# Patient Record
Sex: Male | Born: 1935 | Race: White | Hispanic: No | Marital: Married
Health system: Southern US, Community
[De-identification: ages and names within clinical notes are randomized; demographics above are authoritative.]

## PROBLEM LIST (undated history)

## (undated) DIAGNOSIS — E785 Hyperlipidemia, unspecified: Secondary | ICD-10-CM

## (undated) DIAGNOSIS — E119 Type 2 diabetes mellitus without complications: Secondary | ICD-10-CM

## (undated) DIAGNOSIS — T7840XA Allergy, unspecified, initial encounter: Secondary | ICD-10-CM

## (undated) DIAGNOSIS — F0390 Unspecified dementia without behavioral disturbance: Secondary | ICD-10-CM

## (undated) DIAGNOSIS — K219 Gastro-esophageal reflux disease without esophagitis: Secondary | ICD-10-CM

## (undated) HISTORY — DX: Gastro-esophageal reflux disease without esophagitis: K21.9

## (undated) HISTORY — DX: Allergy, unspecified, initial encounter: T78.40XA

## (undated) HISTORY — PX: HERNIA REPAIR: SHX51

---

## 2005-08-03 ENCOUNTER — Emergency Department (HOSPITAL_COMMUNITY): Admission: EM | Admit: 2005-08-03 | Discharge: 2005-08-03 | Payer: Self-pay | Admitting: Emergency Medicine

## 2008-04-19 ENCOUNTER — Ambulatory Visit (HOSPITAL_COMMUNITY): Admission: RE | Admit: 2008-04-19 | Discharge: 2008-04-19 | Payer: Self-pay | Admitting: Surgery

## 2010-10-01 NOTE — Op Note (Signed)
NAME:  Aaron Farmer, Aaron Farmer NO.:  [id number]   MEDICAL RECORD NO.:  [id number]          PATIENT TYPE:  AMB   LOCATION:  SDS                          FACILITY:  [facility]   PHYSICIAN:  Wilmon Arms. Corliss Skains, M.D. DATE OF BIRTH:  1930/05/21   DATE OF PROCEDURE:  04/19/2008  DATE OF DISCHARGE:  04/19/2008                               OPERATIVE REPORT   PREOPERATIVE DIAGNOSIS:  Right inguinal hernia.   POSTOPERATIVE DIAGNOSIS:  Right direct inguinal hernia.   PROCEDURE PERFORMED:  Right inguinal hernia repair with mesh.   SURGEON:  Wilmon Arms. Corliss Skains, MD, FACS   ANESTHESIA:  General.   INDICATIONS:  The patient is a 77 year old male who presents with a  large bulge in his right groin.  This has been present for some time,  but has become much larger, more uncomfortable.  He was examined and he  was also have a reducible right inguinal hernia.  He presents now for  elective repair.   DESCRIPTION OF PROCEDURE:  The patient was brought to the operating,  placed in supine position on the operating room table.  After an  adequate level of general anesthesia was obtained, the patient's right  groin was shaved, prepped with Betadine and draped in sterile fashion.  A time-out was taken to assure proper patient and proper procedure.  The  area above the right inguinal ligament was infiltrated with 4% Marcaine  with epinephrine.  An oblique incision was made above the inguinal  ligament.  Dissection was carried down to the external oblique fascia  with cautery.  Exposure was obtained with the Weitlaner retractor.  The  fascia was opened along the direction of its fibers down the external  ring.  We bluntly dissected around the spermatic cord retracted with a  Penrose drain.  The very large direct hernia sac was then dissected free  from the surrounding tissue, reduced back through the direct defect.  The floor of the inguinal canal was then closed with a running 0 PDS  suture.  No  indirect hernia sac was noted.  Keyhole mesh was cut from a  3 x 6 inches piece of UltraPro and secured with 2-0 Prolene beginning at  pubic tubercle.  The tails were sutured together behind the spermatic  cord and tucked underneath the external oblique fascia.  The fascia was  reapproximated with 2-0 Vicryl.  A 3-0 Vicryl was used to close the  subcutaneous tissues and a 4-0 Monocryl was used to close the skin.  Steri-Strips and clean dressings were applied.  The patient was then  extubated and brought to recovery room in stable condition.  All  sponges, instruments, and needle counts were correct.      Wilmon Arms. Tsuei, M.D.  Electronically Signed     MKT/MEDQ  D:  04/19/2008  T:  04/19/2008  Job:  [id number]

## 2011-02-18 LAB — BASIC METABOLIC PANEL
BUN: 11
CO2: 31
Chloride: 104
Creatinine, Ser: 0.97
GFR calc Af Amer: >60
Potassium: 4.5

## 2011-02-18 LAB — DIFFERENTIAL
Eosinophils Absolute: 0.1
Eosinophils Relative: 1
Monocytes Absolute: 0.5
Monocytes Relative: 7

## 2011-02-18 LAB — CBC
Hemoglobin: 13.6
RBC: 3.92 — ABNORMAL LOW
WBC: 6.8

## 2012-09-29 ENCOUNTER — Non-Acute Institutional Stay (SKILLED_NURSING_FACILITY): Payer: Medicare Other | Admitting: Nurse Practitioner

## 2012-09-29 DIAGNOSIS — M25569 Pain in unspecified knee: Secondary | ICD-10-CM

## 2012-09-29 DIAGNOSIS — F0280 Dementia in other diseases classified elsewhere without behavioral disturbance: Secondary | ICD-10-CM

## 2012-09-29 DIAGNOSIS — N400 Enlarged prostate without lower urinary tract symptoms: Secondary | ICD-10-CM

## 2012-09-29 DIAGNOSIS — F329 Major depressive disorder, single episode, unspecified: Secondary | ICD-10-CM

## 2012-09-29 DIAGNOSIS — K219 Gastro-esophageal reflux disease without esophagitis: Secondary | ICD-10-CM

## 2012-09-29 DIAGNOSIS — E785 Hyperlipidemia, unspecified: Secondary | ICD-10-CM

## 2012-09-29 DIAGNOSIS — F0393 Unspecified dementia, unspecified severity, with mood disturbance: Secondary | ICD-10-CM

## 2012-09-29 DIAGNOSIS — G309 Alzheimer's disease, unspecified: Secondary | ICD-10-CM

## 2012-09-29 DIAGNOSIS — K5900 Constipation, unspecified: Secondary | ICD-10-CM

## 2012-09-29 NOTE — Progress Notes (Signed)
Patient ID: Aaron Farmer, male   DOB: 05-29-1930, 79 y.o.   MRN: [id number]  Chief Complaint:  Chief Complaint  Patient presents with  . Medical Managment of Chronic Issues     HPI:  Problem List Items Addressed This Visit   GERD (gastroesophageal reflux disease) - Primary     Will try Nexium 20mg  daily for 4 weeks    Other and unspecified hyperlipidemia     Takes Atorvastain 10mg , last LDL 100 10/29/11    Depression due to dementia     Some sadness intermittently.     Alzheimer's disease     Takes Aricept, functioning well in Memory Care Unit, well adjusted in the past year. MMSE 9/30 03/09/12    Unspecified constipation     stable    BPH (benign prostatic hyperplasia)     Stable, no urinary retention.     Pain in joint, lower leg     Still able to ambulate independently w/o complaining of pain.        Review of Systems:  Review of Systems  Constitutional: Negative for fever, chills, weight loss, malaise/fatigue and diaphoresis.  HENT: Positive for hearing loss. Negative for congestion, sore throat, neck pain and ear discharge.   Eyes: Negative for blurred vision, photophobia, pain, discharge and redness.  Respiratory: Positive for sputum production (in am). Negative for cough, shortness of breath and wheezing.   Cardiovascular: Negative for chest pain, palpitations, orthopnea, claudication, leg swelling and PND.  Gastrointestinal: Negative for heartburn, nausea, vomiting, abdominal pain, diarrhea, constipation and blood in stool.  Genitourinary: Positive for frequency. Negative for dysuria, urgency, hematuria and flank pain.  Musculoskeletal: Positive for joint pain. Negative for myalgias, back pain and falls.  Skin: Negative for itching and rash.  Neurological: Negative for dizziness, tingling, tremors, sensory change, speech change, focal weakness, seizures, loss of consciousness, weakness and headaches.  Endo/Heme/Allergies: Negative for environmental allergies  and polydipsia. Does not bruise/bleed easily.  Psychiatric/Behavioral: Positive for depression and memory loss. Negative for hallucinations. The patient is not nervous/anxious and does not have insomnia.      Medications: Reviewed at [facility]   Physical Exam: Physical Exam  Constitutional: He appears well-developed and well-nourished. No distress.  HENT:  Head: Normocephalic and atraumatic.  Nose: Nose normal.  Mouth/Throat: Oropharynx is clear and moist. No oropharyngeal exudate.  Eyes: Conjunctivae and EOM are normal. Pupils are equal, round, and reactive to light. Right eye exhibits no discharge. Left eye exhibits no discharge. No scleral icterus.  Neck: Normal range of motion. Neck supple. No JVD present. No thyromegaly present.  Cardiovascular: Normal rate, regular rhythm, normal heart sounds and intact distal pulses.   No murmur heard. Pulmonary/Chest: Effort normal and breath sounds normal. No respiratory distress. He has no wheezes. He has no rales.  Abdominal: Soft. Bowel sounds are normal. He exhibits no distension. There is no tenderness. There is no rebound.  Musculoskeletal: Normal range of motion. He exhibits no edema and no tenderness.  Lymphadenopathy:    He has no cervical adenopathy.  Neurological: He is alert. He has normal reflexes. He displays normal reflexes. No cranial nerve deficit. He exhibits normal muscle tone. Coordination normal.  Skin: Skin is warm and dry. No rash noted. He is not diaphoretic. No erythema. No pallor.  Psychiatric: His mood appears not anxious. His affect is blunt, labile and inappropriate. His affect is not angry. His speech is not rapid and/or pressured, not delayed, not tangential and not slurred. He is slowed. He is  not agitated, not aggressive, not hyperactive, not withdrawn, not actively hallucinating and not combative. Thought content is not paranoid and not delusional. Cognition and memory are impaired. He expresses impulsivity and  inappropriate judgment. He exhibits a depressed mood. He is communicative. He exhibits abnormal recent memory and abnormal remote memory. He is attentive.     Filed Vitals:   09/30/12 1732  BP: 146/76  Pulse: 80  Temp: 97.4 F (36.3 C)  TempSrc: Tympanic  Resp: 20      Labs reviewed: Basic Metabolic Panel: No results found for this basename: NA, K, CL, CO2, GLUCOSE, BUN, CREATININE, CALCIUM, MG, PHOS, TSH,  in the last 8760 hours  Liver Function Tests: No results found for this basename: AST, ALT, ALKPHOS, BILITOT, PROT, ALBUMIN,  in the last 8760 hours  CBC: No results found for this basename: WBC, NEUTROABS, HGB, HCT, MCV, PLT,  in the last 8760 hours  Anemia Panel: No results found for this basename: IRON, FOLATE, VITAMINB12,  in the last 8760 hours  Significant Diagnostic Results:     Assessment/Plan GERD (gastroesophageal reflux disease) Will try Nexium 20mg  daily for 4 weeks  Other and unspecified hyperlipidemia Takes Atorvastain 10mg , last LDL 100 10/29/11  Depression due to dementia Some sadness intermittently.   Alzheimer's disease Takes Aricept, functioning well in Memory Care Unit, well adjusted in the past year. MMSE 9/30 03/09/12  Unspecified constipation stable  BPH (benign prostatic hyperplasia) Stable, no urinary retention.   Pain in joint, lower leg Still able to ambulate independently w/o complaining of pain.       Family/ staff Communication: monitor for s/s of GERD    Goals of care: SNF and Memory Care Unit.    Labs/tests ordered CBC, CMP, TSH

## 2012-09-30 DIAGNOSIS — N400 Enlarged prostate without lower urinary tract symptoms: Secondary | ICD-10-CM | POA: Insufficient documentation

## 2012-09-30 DIAGNOSIS — E785 Hyperlipidemia, unspecified: Secondary | ICD-10-CM | POA: Insufficient documentation

## 2012-09-30 DIAGNOSIS — K219 Gastro-esophageal reflux disease without esophagitis: Secondary | ICD-10-CM | POA: Insufficient documentation

## 2012-09-30 DIAGNOSIS — M25569 Pain in unspecified knee: Secondary | ICD-10-CM | POA: Insufficient documentation

## 2012-09-30 DIAGNOSIS — G309 Alzheimer's disease, unspecified: Secondary | ICD-10-CM | POA: Insufficient documentation

## 2012-09-30 DIAGNOSIS — K5900 Constipation, unspecified: Secondary | ICD-10-CM | POA: Insufficient documentation

## 2012-09-30 DIAGNOSIS — F0280 Dementia in other diseases classified elsewhere without behavioral disturbance: Secondary | ICD-10-CM | POA: Insufficient documentation

## 2012-09-30 NOTE — Assessment & Plan Note (Signed)
Some sadness intermittently.   

## 2012-09-30 NOTE — Assessment & Plan Note (Addendum)
Takes Aricept, functioning well in Memory Care Unit, well adjusted in the past year. MMSE 9/30 03/09/12  

## 2012-09-30 NOTE — Assessment & Plan Note (Signed)
Will try Nexium 20mg  daily for 4 weeks

## 2012-09-30 NOTE — Assessment & Plan Note (Signed)
Still able to ambulate independently w/o complaining of pain.   

## 2012-09-30 NOTE — Assessment & Plan Note (Signed)
Stable, no urinary retention.  

## 2012-09-30 NOTE — Assessment & Plan Note (Signed)
stable °

## 2012-09-30 NOTE — Assessment & Plan Note (Signed)
Takes Atorvastain 10mg , last LDL 100 10/29/11

## 2012-10-28 LAB — CBC AND DIFFERENTIAL
HCT: 37 % — AB (ref 41–53)
Hemoglobin: 12.7 g/dL — AB (ref 13.5–17.5)
Platelets: 249 10*3/uL (ref 150–399)

## 2012-10-28 LAB — BASIC METABOLIC PANEL
Creatinine: 1 mg/dL (ref 0.6–1.3)
Potassium: 4.4 mmol/L (ref 3.4–5.3)

## 2012-10-28 LAB — HEPATIC FUNCTION PANEL
ALT: 16 U/L (ref 10–40)
AST: 17 U/L (ref 14–40)
Bilirubin, Total: 0.9 mg/dL

## 2012-11-23 LAB — LIPID PANEL
Cholesterol: 146 mg/dL (ref 0–200)
LDL Cholesterol: 89 mg/dL

## 2013-01-06 ENCOUNTER — Non-Acute Institutional Stay (SKILLED_NURSING_FACILITY): Payer: Medicare Other | Admitting: Nurse Practitioner

## 2013-01-06 DIAGNOSIS — K5900 Constipation, unspecified: Secondary | ICD-10-CM

## 2013-01-06 DIAGNOSIS — L258 Unspecified contact dermatitis due to other agents: Secondary | ICD-10-CM

## 2013-01-06 DIAGNOSIS — K219 Gastro-esophageal reflux disease without esophagitis: Secondary | ICD-10-CM

## 2013-01-06 DIAGNOSIS — F0280 Dementia in other diseases classified elsewhere without behavioral disturbance: Secondary | ICD-10-CM

## 2013-01-06 DIAGNOSIS — E785 Hyperlipidemia, unspecified: Secondary | ICD-10-CM

## 2013-01-06 DIAGNOSIS — L853 Xerosis cutis: Secondary | ICD-10-CM

## 2013-01-06 NOTE — Progress Notes (Signed)
Patient ID: Aaron Farmer, male   DOB: [birth year], 79 y.o.   MRN: [id number] Code Status: DNR  Allergies  Allergen Reactions  . Namenda [Memantine Hcl]   . Penicillins     Chief Complaint  Patient presents with  . Medical Managment of Chronic Issues    itching     HPI: Patient is a 79 y.o. male seen in the SNF at [facility] today for evaluation of itching skin and other chronic medical conditions.  Problem List Items Addressed This Visit   Alzheimer's disease     Takes Aricept, functioning well in Memory Care Unit, well adjusted in the past year. MMSE 9/30 03/09/12      Dry skin dermatitis     moisturizer and Claritin 10mg  daily. Diffused scratching marks and eczema upper body and legs.     GERD (gastroesophageal reflux disease) - Primary     Much improved on Nexium 20mg  daily for 4 weeks      Other and unspecified hyperlipidemia     LDL 89 11/23/12, takes Atorvastatin 10mg     Unspecified constipation     Stable on Fiber Laxative II daily.          Review of Systems:  Review of Systems  Constitutional: Negative for fever, chills, weight loss, malaise/fatigue and diaphoresis.  HENT: Positive for hearing loss. Negative for congestion, sore throat, neck pain and ear discharge.   Eyes: Negative for blurred vision, photophobia, pain, discharge and redness.  Respiratory: Positive for sputum production (in am). Negative for cough, shortness of breath and wheezing.   Cardiovascular: Negative for chest pain, palpitations, orthopnea, claudication, leg swelling and PND.  Gastrointestinal: Negative for heartburn, nausea, vomiting, abdominal pain, diarrhea, constipation and blood in stool.  Genitourinary: Positive for frequency. Negative for dysuria, urgency, hematuria and flank pain.  Musculoskeletal: Positive for joint pain. Negative for myalgias, back pain and falls.  Skin: Positive for itching. Negative for rash.  Neurological: Negative for dizziness, tingling,  tremors, sensory change, speech change, focal weakness, seizures, loss of consciousness, weakness and headaches.  Endo/Heme/Allergies: Negative for environmental allergies and polydipsia. Does not bruise/bleed easily.  Psychiatric/Behavioral: Positive for depression and memory loss. Negative for hallucinations. The patient is not nervous/anxious and does not have insomnia.      Past Medical History  Diagnosis Date  . GERD (gastroesophageal reflux disease)     Medications: Reviewed at [facility]   Physical Exam: Physical Exam  Constitutional: He appears well-developed and well-nourished. No distress.  HENT:  Head: Normocephalic and atraumatic.  Nose: Nose normal.  Mouth/Throat: Oropharynx is clear and moist. No oropharyngeal exudate.  Eyes: Conjunctivae and EOM are normal. Pupils are equal, round, and reactive to light. Right eye exhibits no discharge. Left eye exhibits no discharge. No scleral icterus.  Neck: Normal range of motion. Neck supple. No JVD present. No thyromegaly present.  Cardiovascular: Normal rate, regular rhythm, normal heart sounds and intact distal pulses.   No murmur heard. Pulmonary/Chest: Effort normal and breath sounds normal. No respiratory distress. He has no wheezes. He has no rales.  Abdominal: Soft. Bowel sounds are normal. He exhibits no distension. There is no tenderness. There is no rebound.  Musculoskeletal: Normal range of motion. He exhibits no edema and no tenderness.  Lymphadenopathy:    He has no cervical adenopathy.  Neurological: He is alert. He has normal reflexes. He displays normal reflexes. No cranial nerve deficit. He exhibits normal muscle tone. Coordination normal.  Skin: Skin is warm and dry. Rash (chest,  upper back, arms, neck and head/face-dry, scaly, with mild diffuse eczema ) noted. He is not diaphoretic. No erythema. No pallor.  Psychiatric: His mood appears not anxious. His affect is blunt, labile and inappropriate. His affect is not  angry. His speech is not rapid and/or pressured, not delayed, not tangential and not slurred. He is slowed. He is not agitated, not aggressive, not hyperactive, not withdrawn, not actively hallucinating and not combative. Thought content is not paranoid and not delusional. Cognition and memory are impaired. He expresses impulsivity and inappropriate judgment. He exhibits a depressed mood. He is communicative. He exhibits abnormal recent memory and abnormal remote memory. He is attentive.    Filed Vitals:   01/10/13 1722  BP: 106/80  Pulse: 66  Temp: 98.9 F (37.2 C)  TempSrc: Tympanic  Resp: 20      Labs reviewed: Basic Metabolic Panel:  Recent Labs  16/10/96  NA 139  K 4.4  BUN 10  CREATININE 1.0  TSH 3.32   Liver Function Tests:  Recent Labs  10/28/12  AST 17  ALT 16  ALKPHOS 70   CBC:  Recent Labs  10/28/12  WBC 7.3  HGB 12.7*  HCT 37*  PLT 249   Lipid Panel:  Recent Labs  11/23/12  CHOL 146  HDL 37  LDLCALC 89  TRIG 101     Assessment/Plan GERD (gastroesophageal reflux disease) Much improved on Nexium 20mg  daily for 4 weeks    Other and unspecified hyperlipidemia LDL 89 11/23/12, takes Atorvastatin 10mg   Alzheimer's disease Takes Aricept, functioning well in Memory Care Unit, well adjusted in the past year. MMSE 9/30 03/09/12    Unspecified constipation Stable on Fiber Laxative II daily.     Dry skin dermatitis moisturizer and Claritin 10mg  daily. Diffused scratching marks and eczema upper body and legs.     Family/ Staff Communication: observe the patient  Goals of Care: SNF MCU  Labs/tests ordered: none

## 2013-01-10 ENCOUNTER — Encounter: Payer: Self-pay | Admitting: Nurse Practitioner

## 2013-01-10 DIAGNOSIS — L853 Xerosis cutis: Secondary | ICD-10-CM | POA: Insufficient documentation

## 2013-01-10 NOTE — Assessment & Plan Note (Signed)
Takes Aricept, functioning well in Memory Care Unit, well adjusted in the past year. MMSE 9/30 03/09/12  

## 2013-01-10 NOTE — Assessment & Plan Note (Signed)
LDL 89 11/23/12, takes Atorvastatin 10mg 

## 2013-01-10 NOTE — Assessment & Plan Note (Signed)
moisturizer and Claritin 10mg  daily. Diffused scratching marks and eczema upper body and legs.

## 2013-01-10 NOTE — Assessment & Plan Note (Signed)
Much improved on Nexium 20mg  daily for 4 weeks

## 2013-01-10 NOTE — Assessment & Plan Note (Signed)
Stable on Fiber Laxative II daily.   

## 2013-02-23 ENCOUNTER — Encounter: Payer: Self-pay | Admitting: *Deleted

## 2013-03-02 ENCOUNTER — Encounter: Payer: Self-pay | Admitting: Nurse Practitioner

## 2013-03-02 ENCOUNTER — Non-Acute Institutional Stay (SKILLED_NURSING_FACILITY): Payer: Medicare Other | Admitting: Nurse Practitioner

## 2013-03-02 DIAGNOSIS — L853 Xerosis cutis: Secondary | ICD-10-CM

## 2013-03-02 DIAGNOSIS — R609 Edema, unspecified: Secondary | ICD-10-CM

## 2013-03-02 DIAGNOSIS — F0280 Dementia in other diseases classified elsewhere without behavioral disturbance: Secondary | ICD-10-CM

## 2013-03-02 DIAGNOSIS — L258 Unspecified contact dermatitis due to other agents: Secondary | ICD-10-CM

## 2013-03-02 DIAGNOSIS — R600 Localized edema: Secondary | ICD-10-CM | POA: Insufficient documentation

## 2013-03-02 DIAGNOSIS — K5900 Constipation, unspecified: Secondary | ICD-10-CM

## 2013-03-02 NOTE — Assessment & Plan Note (Signed)
Takes Aricept, functioning well in Memory Care Unit, well adjusted in the past year. MMSE 9/30 03/09/12  

## 2013-03-02 NOTE — Progress Notes (Signed)
Patient ID: Aaron Farmer, male   DOB: [birth year], 79 y.o.   MRN: [id number]  Code Status: Full Code  Allergies  Allergen Reactions  . Namenda [Memantine Hcl]   . Penicillins     Chief Complaint  Patient presents with  . Medical Managment of Chronic Issues    ankle edema    HPI: Patient is a 79 y.o. male seen in the SNF at [facility] today for evaluation of BLE edema, itching skin and other chronic medical conditions.  Problem List Items Addressed This Visit   Alzheimer's disease - Primary     Takes Aricept, functioning well in Memory Care Unit, well adjusted in the past year. MMSE 9/30 03/09/12        Dry skin dermatitis     moisturizer and Claritin 10mg  daily. Diffused scratching marks and eczema upper body and legs-better      Edema extremities     BLE-mainly in ankles L>R, left dorsali pedis pulse R>L--will obtain ABI of the LLE, Venous Doppler of the LLE to r/o DVT and popliteal cyst. TED knee high, med weight, on am and off pm.     Unspecified constipation     Stable on Fiber Laxative II daily.            Review of Systems:  Review of Systems  Constitutional: Negative for fever, chills, weight loss, malaise/fatigue and diaphoresis.  HENT: Positive for hearing loss. Negative for congestion, ear discharge and sore throat.   Eyes: Negative for blurred vision, photophobia, pain, discharge and redness.  Respiratory: Positive for sputum production (in am). Negative for cough, shortness of breath and wheezing.   Cardiovascular: Positive for leg swelling. Negative for chest pain, palpitations, orthopnea, claudication and PND.       BLE-mainly in ankles L>R  Gastrointestinal: Negative for heartburn, nausea, vomiting, abdominal pain, diarrhea, constipation and blood in stool.  Genitourinary: Positive for frequency. Negative for dysuria, urgency, hematuria and flank pain.  Musculoskeletal: Positive for joint pain. Negative for back pain, falls, myalgias  and neck pain.  Skin: Positive for itching. Negative for rash.  Neurological: Negative for dizziness, tingling, tremors, sensory change, speech change, focal weakness, seizures, loss of consciousness, weakness and headaches.  Endo/Heme/Allergies: Negative for environmental allergies and polydipsia. Does not bruise/bleed easily.  Psychiatric/Behavioral: Positive for depression and memory loss. Negative for hallucinations. The patient is not nervous/anxious and does not have insomnia.      Past Medical History  Diagnosis Date  . GERD (gastroesophageal reflux disease)     Medications: Reviewed at [facility]   Physical Exam: Physical Exam  Constitutional: He appears well-developed and well-nourished. No distress.  HENT:  Head: Normocephalic and atraumatic.  Nose: Nose normal.  Mouth/Throat: Oropharynx is clear and moist. No oropharyngeal exudate.  Eyes: Conjunctivae and EOM are normal. Pupils are equal, round, and reactive to light. Right eye exhibits no discharge. Left eye exhibits no discharge. No scleral icterus.  Neck: Normal range of motion. Neck supple. No JVD present. No thyromegaly present.  Cardiovascular: Normal rate, regular rhythm, normal heart sounds and intact distal pulses.   No murmur heard. Pulmonary/Chest: Effort normal and breath sounds normal. No respiratory distress. He has no wheezes. He has no rales.  Abdominal: Soft. Bowel sounds are normal. He exhibits no distension. There is no tenderness. There is no rebound.  Musculoskeletal: Normal range of motion. He exhibits edema. He exhibits no tenderness.  BLE mainly in ankles, L>R. The left pedis dorsalis pulse R>L  Lymphadenopathy:  He has no cervical adenopathy.  Neurological: He is alert. He has normal reflexes. No cranial nerve deficit. He exhibits normal muscle tone. Coordination normal.  Skin: Skin is warm and dry. Rash (chest, upper back, arms, neck and head/face-dry, scaly, with mild diffuse eczema ) noted. He is  not diaphoretic. No erythema. No pallor.  Psychiatric: His mood appears not anxious. His affect is blunt, labile and inappropriate. His affect is not angry. His speech is not rapid and/or pressured, not delayed, not tangential and not slurred. He is slowed. He is not agitated, not aggressive, not hyperactive, not withdrawn, not actively hallucinating and not combative. Thought content is not paranoid and not delusional. Cognition and memory are impaired. He expresses impulsivity and inappropriate judgment. He exhibits a depressed mood. He is communicative. He exhibits abnormal recent memory and abnormal remote memory. He is attentive.    Filed Vitals:   03/02/13 1616  BP: 106/80  Pulse: 76  Temp: 97.5 F (36.4 C)  TempSrc: Tympanic  Resp: 20      Labs reviewed: Basic Metabolic Panel:  Recent Labs  16/10/96  NA 139  K 4.4  BUN 10  CREATININE 1.0  TSH 3.32   Liver Function Tests:  Recent Labs  10/28/12  AST 17  ALT 16  ALKPHOS 70   CBC:  Recent Labs  10/28/12  WBC 7.3  HGB 12.7*  HCT 37*  PLT 249   Lipid Panel:  Recent Labs  11/23/12  CHOL 146  HDL 37  LDLCALC 89  TRIG 101     Assessment/Plan Alzheimer's disease Takes Aricept, functioning well in Memory Care Unit, well adjusted in the past year. MMSE 9/30 03/09/12      Unspecified constipation Stable on Fiber Laxative II daily.       Dry skin dermatitis moisturizer and Claritin 10mg  daily. Diffused scratching marks and eczema upper body and legs-better    Edema extremities BLE-mainly in ankles L>R, left dorsali pedis pulse R>L--will obtain ABI of the LLE, Venous Doppler of the LLE to r/o DVT and popliteal cyst. TED knee high, med weight, on am and off pm.     Family/ Staff Communication: observe the patient  Goals of Care: SNF MCU  Labs/tests ordered: ABI and venous Doppler of the LLE

## 2013-03-02 NOTE — Assessment & Plan Note (Signed)
Stable on Fiber Laxative II daily.   

## 2013-03-02 NOTE — Assessment & Plan Note (Signed)
BLE-mainly in ankles L>R, left dorsali pedis pulse R>L--will obtain ABI of the LLE, Venous Doppler of the LLE to r/o DVT and popliteal cyst. TED knee high, med weight, on am and off pm.

## 2013-03-02 NOTE — Assessment & Plan Note (Signed)
moisturizer and Claritin 10mg  daily. Diffused scratching marks and eczema upper body and legs-better

## 2013-05-04 ENCOUNTER — Encounter: Payer: Self-pay | Admitting: Nurse Practitioner

## 2013-05-04 ENCOUNTER — Non-Acute Institutional Stay (SKILLED_NURSING_FACILITY): Payer: Medicare Other | Admitting: Nurse Practitioner

## 2013-05-04 DIAGNOSIS — E785 Hyperlipidemia, unspecified: Secondary | ICD-10-CM

## 2013-05-04 DIAGNOSIS — K5900 Constipation, unspecified: Secondary | ICD-10-CM

## 2013-05-04 DIAGNOSIS — F0280 Dementia in other diseases classified elsewhere without behavioral disturbance: Secondary | ICD-10-CM

## 2013-05-04 DIAGNOSIS — F329 Major depressive disorder, single episode, unspecified: Secondary | ICD-10-CM

## 2013-05-04 DIAGNOSIS — R609 Edema, unspecified: Secondary | ICD-10-CM

## 2013-05-04 DIAGNOSIS — N400 Enlarged prostate without lower urinary tract symptoms: Secondary | ICD-10-CM

## 2013-05-04 DIAGNOSIS — F3289 Other specified depressive episodes: Secondary | ICD-10-CM

## 2013-05-04 DIAGNOSIS — K219 Gastro-esophageal reflux disease without esophagitis: Secondary | ICD-10-CM

## 2013-05-04 DIAGNOSIS — R600 Localized edema: Secondary | ICD-10-CM

## 2013-05-04 NOTE — Assessment & Plan Note (Signed)
BLE-mainly in ankles L>R, left dorsali pedis pulse R>L--will obtain ABI of the LLE, Venous Doppler of the LLE to r/o DVT and popliteal cyst. TED knee high, med weight, on am and off pm.  

## 2013-05-04 NOTE — Assessment & Plan Note (Signed)
LDL at goal 11/23/12--dc Atorvastatin--update lipid panel in 6 months.

## 2013-05-04 NOTE — Assessment & Plan Note (Signed)
Much improved on Nexium 20mg daily for 4 weeks. Currently is off.     

## 2013-05-04 NOTE — Assessment & Plan Note (Signed)
Stable, no urinary retention.  

## 2013-05-04 NOTE — Assessment & Plan Note (Signed)
Stable on Fiber Laxative II daily.   

## 2013-05-04 NOTE — Progress Notes (Signed)
Patient ID: Aaron Farmer, male   DOB: [birth year], 79 y.o.   MRN: [id number]   Code Status: DNR  Allergies  Allergen Reactions  . Namenda [Memantine Hcl]   . Penicillins     Chief Complaint  Patient presents with  . Medical Managment of Chronic Issues    left upper eyelid stye  . Acute Visit    HPI: Patient is a 79 y.o. male seen in the SNF at [facility] today for evaluation of left upper eyelid stye, lipids,  and other chronic medical conditions.  Problem List Items Addressed This Visit   GERD (gastroesophageal reflux disease) - Primary     Much improved on Nexium 20mg  daily for 4 weeks. Currently is off.         Other and unspecified hyperlipidemia     LDL at goal 11/23/12--dc Atorvastatin--update lipid panel in 6 months.     Depression due to dementia     Some sadness intermittently.       Alzheimer's disease     Takes Aricept, functioning well in Memory Care Unit, well adjusted in the past year. MMSE 9/30 03/09/12          Unspecified constipation     Stable on Fiber Laxative II daily.           BPH (benign prostatic hyperplasia)     Stable, no urinary retention.       Edema extremities     BLE-mainly in ankles L>R, left dorsali pedis pulse R>L--will obtain ABI of the LLE, Venous Doppler of the LLE to r/o DVT and popliteal cyst. TED knee high, med weight, on am and off pm.          Review of Systems:  Review of Systems  Constitutional: Negative for fever, chills, weight loss, malaise/fatigue and diaphoresis.  HENT: Positive for hearing loss. Negative for congestion, ear discharge and sore throat.   Eyes: Negative for blurred vision, photophobia, pain, discharge and redness.       Left upper eyelid stye  Respiratory: Positive for sputum production (in am). Negative for cough, shortness of breath and wheezing.   Cardiovascular: Positive for leg swelling. Negative for chest pain, palpitations, orthopnea, claudication and PND.     BLE-mainly in ankles L>R  Gastrointestinal: Negative for heartburn, nausea, vomiting, abdominal pain, diarrhea, constipation and blood in stool.  Genitourinary: Positive for frequency. Negative for dysuria, urgency, hematuria and flank pain.  Musculoskeletal: Positive for joint pain. Negative for back pain, falls, myalgias and neck pain.  Skin: Positive for itching. Negative for rash.  Neurological: Negative for dizziness, tingling, tremors, sensory change, speech change, focal weakness, seizures, loss of consciousness, weakness and headaches.  Endo/Heme/Allergies: Negative for environmental allergies and polydipsia. Does not bruise/bleed easily.  Psychiatric/Behavioral: Positive for depression and memory loss. Negative for hallucinations. The patient is not nervous/anxious and does not have insomnia.      Past Medical History  Diagnosis Date  . GERD (gastroesophageal reflux disease)    Medications: Patient's Medications  New Prescriptions   No medications on file  Previous Medications   CALCIUM CARB-CHOLECALCIFEROL (CALCIUM 600 + D) 600-200 MG-UNIT TABS    Take by mouth daily.   CLOPIDOGREL (PLAVIX) 75 MG TABLET    Take 75 mg by mouth daily.   DONEPEZIL (ARICEPT) 10 MG TABLET    Take 10 mg by mouth daily.   LORATADINE (CLARITIN) 10 MG TABLET    Take 10 mg by mouth daily.   PSYLLIUM (REGULOID) 0.52 G  CAPSULE    Take 0.52 g by mouth daily.   PYRIDOXINE (VITAMIN B-6) 100 MG TABLET    Take 100 mg by mouth daily.   VITAMIN B-12 (CYANOCOBALAMIN) 1000 MCG TABLET    Take 1,000 mcg by mouth daily.  Modified Medications   No medications on file  Discontinued Medications   ATORVASTATIN CALCIUM PO    Take 10 mg by mouth at bedtime.     Physical Exam: Physical Exam  Constitutional: He appears well-developed and well-nourished. No distress.  HENT:  Head: Normocephalic and atraumatic.  Nose: Nose normal.  Mouth/Throat: Oropharynx is clear and moist. No oropharyngeal exudate.  Eyes:  Conjunctivae and EOM are normal. Pupils are equal, round, and reactive to light. Right eye exhibits no discharge. Left eye exhibits no discharge. No scleral icterus.  Left upper eyelid stye-healing.   Neck: Normal range of motion. Neck supple. No JVD present. No thyromegaly present.  Cardiovascular: Normal rate, regular rhythm, normal heart sounds and intact distal pulses.   No murmur heard. Pulmonary/Chest: Effort normal and breath sounds normal. No respiratory distress. He has no wheezes. He has no rales.  Abdominal: Soft. Bowel sounds are normal. He exhibits no distension. There is no tenderness. There is no rebound.  Musculoskeletal: Normal range of motion. He exhibits edema. He exhibits no tenderness.  BLE mainly in ankles, L>R. The left pedis dorsalis pulse R>L  Lymphadenopathy:    He has no cervical adenopathy.  Neurological: He is alert. He has normal reflexes. No cranial nerve deficit. He exhibits normal muscle tone. Coordination normal.  Skin: Skin is warm and dry. Rash (chest, upper back, arms, neck and head/face-dry, scaly, with mild diffuse eczema ) noted. He is not diaphoretic. No erythema. No pallor.  Psychiatric: His mood appears not anxious. His affect is blunt, labile and inappropriate. His affect is not angry. His speech is not rapid and/or pressured, not delayed, not tangential and not slurred. He is slowed. He is not agitated, not aggressive, not hyperactive, not withdrawn, not actively hallucinating and not combative. Thought content is not paranoid and not delusional. Cognition and memory are impaired. He expresses impulsivity and inappropriate judgment. He exhibits a depressed mood. He is communicative. He exhibits abnormal recent memory and abnormal remote memory. He is attentive.    Filed Vitals:   05/04/13 1553  BP: 110/76  Pulse: 84  Temp: 98 F (36.7 C)  TempSrc: Tympanic  Resp: 20      Labs reviewed: Basic Metabolic Panel:  Recent Labs  16/10/96  NA 139    K 4.4  BUN 10  CREATININE 1.0  TSH 3.32   Liver Function Tests:  Recent Labs  10/28/12  AST 17  ALT 16  ALKPHOS 70   CBC:  Recent Labs  10/28/12  WBC 7.3  HGB 12.7*  HCT 37*  PLT 249   Lipid Panel:  Recent Labs  11/23/12  CHOL 146  HDL 37  LDLCALC 89  TRIG 101   Assessment/Plan GERD (gastroesophageal reflux disease) Much improved on Nexium 20mg  daily for 4 weeks. Currently is off.       Other and unspecified hyperlipidemia LDL at goal 11/23/12--dc Atorvastatin--update lipid panel in 6 months.   Depression due to dementia Some sadness intermittently.     Alzheimer's disease Takes Aricept, functioning well in Memory Care Unit, well adjusted in the past year. MMSE 9/30 03/09/12        Unspecified constipation Stable on Fiber Laxative II daily.  BPH (benign prostatic hyperplasia) Stable, no urinary retention.     Edema extremities BLE-mainly in ankles L>R, left dorsali pedis pulse R>L--will obtain ABI of the LLE, Venous Doppler of the LLE to r/o DVT and popliteal cyst. TED knee high, med weight, on am and off pm.       Family/ Staff Communication: observe the patient  Goals of Care: SNF  Labs/tests ordered: Lipid panel in 6 months.

## 2013-05-04 NOTE — Assessment & Plan Note (Signed)
Takes Aricept, functioning well in Memory Care Unit, well adjusted in the past year. MMSE 9/30 03/09/12  

## 2013-05-04 NOTE — Assessment & Plan Note (Signed)
Some sadness intermittently.   

## 2013-07-13 ENCOUNTER — Non-Acute Institutional Stay (SKILLED_NURSING_FACILITY): Payer: Medicare Other | Admitting: Nurse Practitioner

## 2013-07-13 ENCOUNTER — Encounter: Payer: Self-pay | Admitting: Nurse Practitioner

## 2013-07-13 DIAGNOSIS — F0280 Dementia in other diseases classified elsewhere without behavioral disturbance: Secondary | ICD-10-CM

## 2013-07-13 DIAGNOSIS — R600 Localized edema: Secondary | ICD-10-CM

## 2013-07-13 DIAGNOSIS — G309 Alzheimer's disease, unspecified: Secondary | ICD-10-CM

## 2013-07-13 DIAGNOSIS — F0393 Unspecified dementia, unspecified severity, with mood disturbance: Secondary | ICD-10-CM

## 2013-07-13 DIAGNOSIS — F329 Major depressive disorder, single episode, unspecified: Secondary | ICD-10-CM

## 2013-07-13 DIAGNOSIS — R040 Epistaxis: Secondary | ICD-10-CM

## 2013-07-13 DIAGNOSIS — F3289 Other specified depressive episodes: Secondary | ICD-10-CM

## 2013-07-13 DIAGNOSIS — N400 Enlarged prostate without lower urinary tract symptoms: Secondary | ICD-10-CM

## 2013-07-13 DIAGNOSIS — R609 Edema, unspecified: Secondary | ICD-10-CM

## 2013-07-13 DIAGNOSIS — K5900 Constipation, unspecified: Secondary | ICD-10-CM

## 2013-07-13 DIAGNOSIS — L600 Ingrowing nail: Secondary | ICD-10-CM

## 2013-07-13 DIAGNOSIS — K219 Gastro-esophageal reflux disease without esophagitis: Secondary | ICD-10-CM

## 2013-07-13 DIAGNOSIS — M25569 Pain in unspecified knee: Secondary | ICD-10-CM

## 2013-07-13 NOTE — Progress Notes (Signed)
Patient ID: Aaron Farmer, male   DOB: 1931-03-25, 80 y.o.   MRN: [id number]   Code Status: DNR  Allergies  Allergen Reactions  . Namenda [Memantine Hcl]   . Penicillins     Chief Complaint  Patient presents with  . Medical Managment of Chronic Issues    left infected great toe ingrowing toe nail. The right nosebleed.   . Acute Visit    HPI: Patient is a 80 y.o. male seen in the SNF at [facility] today for evaluation of the left great toe nfected ingrowing toenail, the right nosebleed, and other chronic medical conditions.  Problem List Items Addressed This Visit   Unspecified constipation     Stable on Fiber Laxative II daily.       Pain in joint, lower leg     Still able to ambulate independently w/o complaining of pain.     Ingrowing toenail with infection - Primary     Left great toe-small amount of purulent drainage seen at the medial aspect of the left great toe-erythema and warmth and tenderness noted-open to air and ABT ointment. Observe     GERD (gastroesophageal reflux disease)     Much improved on Nexium 20mg  daily for 4 weeks. Currently is off.       Epistaxis     The right nostril, 2x2 saline gauze packed-remove in am, Afrin I spray prn, Saline nasal spray I each nostril tid, hold Plavix, check CBC and CMP in am. Observe the patient.     Edema extremities     BLE-mainly in ankles L>R, venous US ruled out DVT, compression hosiery daily     Depression due to dementia     Some sadness intermittently.      BPH (benign prostatic hyperplasia)     Stable, no urinary retention.       Alzheimer's disease     Takes Aricept, functioning well in Memory Care Unit, well adjusted in the past year. MMSE 9/30 03/09/12        Review of Systems:  Review of Systems  Constitutional: Negative for fever, chills, weight loss, malaise/fatigue and diaphoresis.  HENT: Positive for hearing loss and nosebleeds. Negative for congestion, ear discharge and  sore throat.        The right nostril.   Eyes: Negative for blurred vision, double vision, photophobia, pain, discharge and redness.  Respiratory: Positive for sputum production (in am). Negative for cough, shortness of breath and wheezing.   Cardiovascular: Positive for leg swelling. Negative for chest pain, palpitations, orthopnea, claudication and PND.       BLE-mainly in ankles L>R  Gastrointestinal: Negative for heartburn, nausea, vomiting, abdominal pain, diarrhea, constipation and blood in stool.  Genitourinary: Positive for frequency. Negative for dysuria, urgency, hematuria and flank pain.  Musculoskeletal: Positive for joint pain. Negative for back pain, falls, myalgias and neck pain.  Skin: Positive for itching. Negative for rash.       The left great toe erythematous, warmth, tenderness, a small amount of purulent drainage seen at the medial aspect of the left great toenail margin  Neurological: Negative for dizziness, tingling, tremors, sensory change, speech change, focal weakness, seizures, loss of consciousness, weakness and headaches.  Endo/Heme/Allergies: Negative for environmental allergies and polydipsia. Does not bruise/bleed easily.  Psychiatric/Behavioral: Positive for depression and memory loss. Negative for hallucinations. The patient is not nervous/anxious and does not have insomnia.      Past Medical History  Diagnosis Date  . GERD (gastroesophageal reflux  disease)    Medications: Patient's Medications  New Prescriptions   No medications on file  Previous Medications   CALCIUM CARB-CHOLECALCIFEROL (CALCIUM 600 + D) 600-200 MG-UNIT TABS    Take by mouth daily.   CLOPIDOGREL (PLAVIX) 75 MG TABLET    Take 75 mg by mouth daily.   DONEPEZIL (ARICEPT) 10 MG TABLET    Take 10 mg by mouth daily.   LORATADINE (CLARITIN) 10 MG TABLET    Take 10 mg by mouth daily.   PSYLLIUM (REGULOID) 0.52 G CAPSULE    Take 0.52 g by mouth daily.   PYRIDOXINE (VITAMIN B-6) 100 MG  TABLET    Take 100 mg by mouth daily.   VITAMIN B-12 (CYANOCOBALAMIN) 1000 MCG TABLET    Take 1,000 mcg by mouth daily.  Modified Medications   No medications on file  Discontinued Medications   No medications on file     Physical Exam: Physical Exam  Constitutional: He appears well-developed and well-nourished. No distress.  HENT:  Head: Normocephalic and atraumatic.  Nose: Nose normal.  Mouth/Throat: Oropharynx is clear and moist. No oropharyngeal exudate.  The right nosebleed-packed with 2x2 saline gauze.   Eyes: Conjunctivae and EOM are normal. Pupils are equal, round, and reactive to light. Right eye exhibits no discharge. Left eye exhibits no discharge. No scleral icterus.  Neck: Normal range of motion. Neck supple. No JVD present. No thyromegaly present.  Cardiovascular: Normal rate, regular rhythm, normal heart sounds and intact distal pulses.   No murmur heard. Pulmonary/Chest: Effort normal and breath sounds normal. No respiratory distress. He has no wheezes. He has no rales.  Abdominal: Soft. Bowel sounds are normal. He exhibits no distension. There is no tenderness. There is no rebound.  Musculoskeletal: Normal range of motion. He exhibits edema. He exhibits no tenderness.  BLE mainly in ankles, L>R. The left pedis dorsalis pulse R>L  Lymphadenopathy:    He has no cervical adenopathy.  Neurological: He is alert. He has normal reflexes. No cranial nerve deficit. He exhibits normal muscle tone. Coordination normal.  Skin: Skin is warm and dry. Rash (chest, upper back, arms, neck and head/face-dry, scaly, with mild diffuse eczema ) noted. He is not diaphoretic. No erythema. No pallor.  The left great toe erythema, warmth, tenderness, small amount purulent drainage seen at the margin of medial aspect of the left great toenail.    Psychiatric: His mood appears not anxious. His affect is blunt, labile and inappropriate. His affect is not angry. His speech is not rapid and/or  pressured, not delayed, not tangential and not slurred. He is slowed. He is not agitated, not aggressive, not hyperactive, not withdrawn, not actively hallucinating and not combative. Thought content is not paranoid and not delusional. Cognition and memory are impaired. He expresses impulsivity and inappropriate judgment. He exhibits a depressed mood. He is communicative. He exhibits abnormal recent memory and abnormal remote memory. He is attentive.    Filed Vitals:   07/13/13 1449  BP: 120/80  Pulse: 82  Temp: 97.6 F (36.4 C)  TempSrc: Tympanic  Resp: 20      Labs reviewed: Basic Metabolic Panel:  Recent Labs  16/10/96  NA 139  K 4.4  BUN 10  CREATININE 1.0  TSH 3.32   Liver Function Tests:  Recent Labs  10/28/12  AST 17  ALT 16  ALKPHOS 70   CBC:  Recent Labs  10/28/12  WBC 7.3  HGB 12.7*  HCT 37*  PLT 249   Lipid  Panel:  Recent Labs  11/23/12  CHOL 146  HDL 37  LDLCALC 89  TRIG 101   Assessment/Plan Ingrowing toenail with infection Left great toe-small amount of purulent drainage seen at the medial aspect of the left great toe-erythema and warmth and tenderness noted-open to air and ABT ointment. Observe   Edema extremities BLE-mainly in ankles L>R, venous US ruled out DVT, compression hosiery daily   Pain in joint, lower leg Still able to ambulate independently w/o complaining of pain.   BPH (benign prostatic hyperplasia) Stable, no urinary retention.     Unspecified constipation Stable on Fiber Laxative II daily.     Alzheimer's disease Takes Aricept, functioning well in Memory Care Unit, well adjusted in the past year. MMSE 9/30 03/09/12   Depression due to dementia Some sadness intermittently.    GERD (gastroesophageal reflux disease) Much improved on Nexium 20mg  daily for 4 weeks. Currently is off.     Epistaxis The right nostril, 2x2 saline gauze packed-remove in am, Afrin I spray prn, Saline nasal spray I each nostril  tid, hold Plavix, check CBC and CMP in am. Observe the patient.     Family/ Staff Communication: observe the patient  Goals of Care: SNF  Labs/tests ordered: Lipid panel in 6 months scheduled. CBC and CMP in am.

## 2013-07-14 DIAGNOSIS — L600 Ingrowing nail: Secondary | ICD-10-CM | POA: Insufficient documentation

## 2013-07-14 NOTE — Assessment & Plan Note (Signed)
Takes Aricept, functioning well in Memory Care Unit, well adjusted in the past year. MMSE 9/30 03/09/12

## 2013-07-14 NOTE — Assessment & Plan Note (Signed)
Stable on Fiber Laxative II daily.   

## 2013-07-14 NOTE — Assessment & Plan Note (Signed)
Still able to ambulate independently w/o complaining of pain.   

## 2013-07-14 NOTE — Assessment & Plan Note (Signed)
Much improved on Nexium 20mg daily for 4 weeks. Currently is off.     

## 2013-07-14 NOTE — Assessment & Plan Note (Signed)
Some sadness intermittently.

## 2013-07-14 NOTE — Assessment & Plan Note (Signed)
BLE-mainly in ankles L>R, venous US ruled out DVT, compression hosiery daily

## 2013-07-14 NOTE — Assessment & Plan Note (Addendum)
Left great toe-small amount of purulent drainage seen at the medial aspect of the left great toe-erythema and warmth and tenderness noted-open to air and ABT ointment. Observe

## 2013-07-14 NOTE — Assessment & Plan Note (Signed)
Stable, no urinary retention.  

## 2013-07-16 DIAGNOSIS — R040 Epistaxis: Secondary | ICD-10-CM | POA: Insufficient documentation

## 2013-07-16 LAB — BASIC METABOLIC PANEL
BUN: 16 mg/dL (ref 4–21)
CREATININE: 1 mg/dL (ref 0.6–1.3)
GLUCOSE: 137 mg/dL
POTASSIUM: 4.1 mmol/L (ref 3.4–5.3)
Sodium: 142 mmol/L (ref 137–147)

## 2013-07-16 LAB — CBC AND DIFFERENTIAL
HCT: 39 % — AB (ref 41–53)
Hemoglobin: 13.3 g/dL — AB (ref 13.5–17.5)
PLATELETS: 274 10*3/uL (ref 150–399)
WBC: 7 10*3/mL

## 2013-07-16 NOTE — Assessment & Plan Note (Signed)
The right nostril, 2x2 saline gauze packed-remove in am, Afrin I spray prn, Saline nasal spray I each nostril tid, hold Plavix, check CBC and CMP in am. Observe the patient.

## 2013-09-15 ENCOUNTER — Non-Acute Institutional Stay (SKILLED_NURSING_FACILITY): Payer: Medicare Other | Admitting: Nurse Practitioner

## 2013-09-15 ENCOUNTER — Encounter: Payer: Self-pay | Admitting: Nurse Practitioner

## 2013-09-15 DIAGNOSIS — E785 Hyperlipidemia, unspecified: Secondary | ICD-10-CM

## 2013-09-15 DIAGNOSIS — F329 Major depressive disorder, single episode, unspecified: Secondary | ICD-10-CM

## 2013-09-15 DIAGNOSIS — F0393 Unspecified dementia, unspecified severity, with mood disturbance: Secondary | ICD-10-CM

## 2013-09-15 DIAGNOSIS — M25569 Pain in unspecified knee: Secondary | ICD-10-CM

## 2013-09-15 DIAGNOSIS — F3289 Other specified depressive episodes: Secondary | ICD-10-CM

## 2013-09-15 DIAGNOSIS — R609 Edema, unspecified: Secondary | ICD-10-CM

## 2013-09-15 DIAGNOSIS — K219 Gastro-esophageal reflux disease without esophagitis: Secondary | ICD-10-CM

## 2013-09-15 DIAGNOSIS — N400 Enlarged prostate without lower urinary tract symptoms: Secondary | ICD-10-CM

## 2013-09-15 DIAGNOSIS — L600 Ingrowing nail: Secondary | ICD-10-CM

## 2013-09-15 DIAGNOSIS — L258 Unspecified contact dermatitis due to other agents: Secondary | ICD-10-CM

## 2013-09-15 DIAGNOSIS — K5900 Constipation, unspecified: Secondary | ICD-10-CM

## 2013-09-15 DIAGNOSIS — R040 Epistaxis: Secondary | ICD-10-CM

## 2013-09-15 DIAGNOSIS — R600 Localized edema: Secondary | ICD-10-CM

## 2013-09-15 DIAGNOSIS — L853 Xerosis cutis: Secondary | ICD-10-CM

## 2013-09-15 DIAGNOSIS — F0280 Dementia in other diseases classified elsewhere without behavioral disturbance: Secondary | ICD-10-CM

## 2013-09-15 DIAGNOSIS — G309 Alzheimer's disease, unspecified: Secondary | ICD-10-CM

## 2013-09-15 NOTE — Progress Notes (Signed)
Patient ID: Aaron OvermanWilliam Beggs, male   DOB: November 09, 1930, 80 y.o.   MRN: [id number]   Code Status: DNR  Allergies  Allergen Reactions  . Namenda [Memantine Hcl]   . Penicillins     Chief Complaint  Patient presents with  . Medical Management of Chronic Issues    HPI: Patient is a 80 y.o. male [facility] for evaluation of chronic medical conditions.  Problem List Items Addressed This Visit   Alzheimer's disease     akes Aricept, functioning well in Memory Care Unit, well adjusted. MMSE 9/30 03/09/12      BPH (benign prostatic hyperplasia)     Stable, no urinary retention.        Depression due to dementia     Some sadness intermittently-no change.       Dry skin dermatitis     moisturizer and Claritin 10mg  daily. Diffused scratching injuries and eczema upper body and legs-better      Edema extremities     Chronic trace edema BLE. No cough, sputum production or paroxysmal nocturnal orthopnea. Continue compression hosiery and monitor the patient.     Epistaxis     No further nose bleed since off Plavix.     GERD (gastroesophageal reflux disease)     Much improved on Nexium 20mg  daily for 4 weeks. Currently is off.        Ingrowing toenail with infection     Healed.     Other and unspecified hyperlipidemia     LDL 89 11/23/12, off Atorvastatin 10mg , update lipid panel scheduled.      Pain in joint, lower leg     Still able to ambulate independently w/o complaining of pain.      Unspecified constipation - Primary     Stable on Fiber Laxative II daily.         Review of Systems:  Review of Systems  Constitutional: Negative for fever, chills, weight loss, malaise/fatigue and diaphoresis.  HENT: Positive for hearing loss. Negative for congestion, ear discharge, nosebleeds and sore throat.   Eyes: Negative for blurred vision, double vision, photophobia, pain, discharge and redness.  Respiratory: Negative for cough, sputum  production (in am), shortness of breath and wheezing.   Cardiovascular: Positive for leg swelling. Negative for chest pain, palpitations, orthopnea, claudication and PND.       BLE-mainly in ankles L>R  Gastrointestinal: Negative for heartburn, nausea, vomiting, abdominal pain, diarrhea, constipation and blood in stool.  Genitourinary: Positive for frequency. Negative for dysuria, urgency, hematuria and flank pain.  Musculoskeletal: Positive for joint pain. Negative for back pain, falls, myalgias and neck pain.  Skin: Positive for itching. Negative for rash.       chronic  Neurological: Negative for dizziness, tingling, tremors, sensory change, speech change, focal weakness, seizures, loss of consciousness, weakness and headaches.  Endo/Heme/Allergies: Negative for environmental allergies and polydipsia. Does not bruise/bleed easily.  Psychiatric/Behavioral: Positive for depression and memory loss. Negative for hallucinations. The patient is not nervous/anxious and does not have insomnia.      Past Medical History  Diagnosis Date  . GERD (gastroesophageal reflux disease)   . Allergy    Medications: Patient's Medications  New Prescriptions   No medications on file  Previous Medications   CALCIUM CARB-CHOLECALCIFEROL (CALCIUM 600 + D) 600-200 MG-UNIT TABS    Take by mouth daily.   DONEPEZIL (ARICEPT) 10 MG TABLET    Take 10 mg by mouth daily.   LORATADINE (  CLARITIN) 10 MG TABLET    Take 10 mg by mouth daily.   PSYLLIUM (REGULOID) 0.52 G CAPSULE    Take 0.52 g by mouth daily.   PYRIDOXINE (VITAMIN B-6) 100 MG TABLET    Take 100 mg by mouth daily.   VITAMIN B-12 (CYANOCOBALAMIN) 1000 MCG TABLET    Take 1,000 mcg by mouth daily.  Modified Medications   No medications on file  Discontinued Medications   CLOPIDOGREL (PLAVIX) 75 MG TABLET    Take 75 mg by mouth daily.     Physical Exam: Physical Exam  Constitutional: He appears well-developed and well-nourished. No distress.  HENT:    Head: Normocephalic and atraumatic.  Nose: Nose normal.  Mouth/Throat: Oropharynx is clear and moist. No oropharyngeal exudate.  Eyes: Conjunctivae and EOM are normal. Pupils are equal, round, and reactive to light. Right eye exhibits no discharge. Left eye exhibits no discharge. No scleral icterus.  Neck: Normal range of motion. Neck supple. No JVD present. No thyromegaly present.  Cardiovascular: Normal rate, regular rhythm, normal heart sounds and intact distal pulses.   No murmur heard. Pulmonary/Chest: Effort normal and breath sounds normal. No respiratory distress. He has no wheezes. He has no rales.  Abdominal: Soft. Bowel sounds are normal. He exhibits no distension. There is no tenderness. There is no rebound.  Musculoskeletal: Normal range of motion. He exhibits edema. He exhibits no tenderness.  BLE mainly in ankles, L>R. The left pedis dorsalis pulse R>L  Lymphadenopathy:    He has no cervical adenopathy.  Neurological: He is alert. He has normal reflexes. No cranial nerve deficit. He exhibits normal muscle tone. Coordination normal.  Skin: Skin is warm and dry. Rash (chest, upper back, arms, neck and head/face-dry, scaly, with mild diffuse eczema ) noted. He is not diaphoretic. No erythema. No pallor.     Psychiatric: His mood appears not anxious. His affect is blunt, labile and inappropriate. His affect is not angry. His speech is not rapid and/or pressured, not delayed, not tangential and not slurred. He is slowed. He is not agitated, not aggressive, not hyperactive, not withdrawn, not actively hallucinating and not combative. Thought content is not paranoid and not delusional. Cognition and memory are impaired. He expresses impulsivity and inappropriate judgment. He exhibits a depressed mood. He is communicative. He exhibits abnormal recent memory and abnormal remote memory. He is attentive.    Filed Vitals:   09/15/13 1044  BP: 118/62  Pulse: 70  Temp: 98.2 F (36.8 C)   TempSrc: Tympanic  Resp: 20      Labs reviewed: Basic Metabolic Panel:  Recent Labs  40/98/1105/05/01 07/16/13  NA 139 142  K 4.4 4.1  BUN 10 16  CREATININE 1.0 1.0  TSH 3.32  --    Liver Function Tests:  Recent Labs  10/28/12  AST 17  ALT 16  ALKPHOS 70   CBC:  Recent Labs  10/28/12 07/16/13  WBC 7.3 7.0  HGB 12.7* 13.3*  HCT 37* 39*  PLT 249 274   Lipid Panel:  Recent Labs  11/23/12  CHOL 146  HDL 37  LDLCALC 89  TRIG 101   Assessment/Plan Unspecified constipation Stable on Fiber Laxative II daily.    Pain in joint, lower leg Still able to ambulate independently w/o complaining of pain.    Other and unspecified hyperlipidemia LDL 89 11/23/12, off Atorvastatin 10mg , update lipid panel scheduled.    GERD (gastroesophageal reflux disease) Much improved on Nexium 20mg  daily for 4 weeks. Currently  is off.      Epistaxis No further nose bleed since off Plavix.   Edema extremities Chronic trace edema BLE. No cough, sputum production or paroxysmal nocturnal orthopnea. Continue compression hosiery and monitor the patient.   Alzheimer's disease akes Aricept, functioning well in Memory Care Unit, well adjusted. MMSE 9/30 03/09/12    BPH (benign prostatic hyperplasia) Stable, no urinary retention.      Dry skin dermatitis moisturizer and Claritin 10mg  daily. Diffused scratching injuries and eczema upper body and legs-better    Depression due to dementia Some sadness intermittently-no change.     Ingrowing toenail with infection Healed.     Family/ Staff Communication: observe the patient  Goals of Care: SNF  Labs/tests ordered: none

## 2013-09-15 NOTE — Assessment & Plan Note (Addendum)
Chronic trace edema BLE. No cough, sputum production or paroxysmal nocturnal orthopnea. Continue compression hosiery and monitor the patient 

## 2013-09-15 NOTE — Assessment & Plan Note (Signed)
Stable on Fiber Laxative II daily.

## 2013-09-15 NOTE — Assessment & Plan Note (Signed)
Much improved on Nexium 20mg  daily for 4 weeks. Currently is off.

## 2013-09-15 NOTE — Assessment & Plan Note (Signed)
No further nose bleed since off Plavix.   

## 2013-09-15 NOTE — Assessment & Plan Note (Signed)
akes Aricept, functioning well in Memory Care Unit, well adjusted. MMSE 9/30 03/09/12

## 2013-09-15 NOTE — Assessment & Plan Note (Signed)
Stable, no urinary retention.  

## 2013-09-15 NOTE — Assessment & Plan Note (Signed)
Some sadness intermittently-no change.

## 2013-09-15 NOTE — Assessment & Plan Note (Signed)
Healed

## 2013-09-15 NOTE — Assessment & Plan Note (Signed)
LDL 89 11/23/12, off Atorvastatin 10mg , update lipid panel scheduled.

## 2013-09-15 NOTE — Assessment & Plan Note (Signed)
Still able to ambulate independently w/o complaining of pain.

## 2013-09-15 NOTE — Assessment & Plan Note (Signed)
moisturizer and Claritin 10mg  daily. Diffused scratching injuries and eczema upper body and legs-better

## 2013-10-05 ENCOUNTER — Encounter: Payer: Self-pay | Admitting: Nurse Practitioner

## 2013-10-05 ENCOUNTER — Non-Acute Institutional Stay (SKILLED_NURSING_FACILITY): Payer: Medicare Other | Admitting: Nurse Practitioner

## 2013-10-05 DIAGNOSIS — F0280 Dementia in other diseases classified elsewhere without behavioral disturbance: Secondary | ICD-10-CM

## 2013-10-05 DIAGNOSIS — L853 Xerosis cutis: Secondary | ICD-10-CM

## 2013-10-05 DIAGNOSIS — R600 Localized edema: Secondary | ICD-10-CM

## 2013-10-05 DIAGNOSIS — R609 Edema, unspecified: Secondary | ICD-10-CM

## 2013-10-05 DIAGNOSIS — K5900 Constipation, unspecified: Secondary | ICD-10-CM

## 2013-10-05 DIAGNOSIS — G309 Alzheimer's disease, unspecified: Secondary | ICD-10-CM

## 2013-10-05 DIAGNOSIS — L258 Unspecified contact dermatitis due to other agents: Secondary | ICD-10-CM

## 2013-10-05 NOTE — Progress Notes (Signed)
Patient ID: Aaron Farmer, male   DOB: 12-19-1930, 80 y.o.   MRN: [id number]   Code Status: DNR  Allergies  Allergen Reactions  . Namenda [Memantine Hcl]   . Penicillins     Chief Complaint  Patient presents with  . Medical Management of Chronic Issues    HPI: Patient is a 80 y.o. male [facility] for evaluation of chronic medical conditions.  Problem List Items Addressed This Visit   Alzheimer's disease     takes Aricept, functioning well in Memory Care Unit, well adjusted. MMSE 9/30 03/09/12      Dry skin dermatitis     moisturizer and Claritin 10mg  daily. Diffused scratching injuries and eczema upper body and legs-better      Edema extremities - Primary     Chronic trace edema BLE. No cough, sputum production or paroxysmal nocturnal orthopnea. Continue compression hosiery and monitor the patient.      Unspecified constipation     Stable on Fiber Laxative II daily.         Review of Systems:  Review of Systems  Constitutional: Negative for fever, chills, weight loss, malaise/fatigue and diaphoresis.  HENT: Positive for hearing loss. Negative for congestion, ear discharge, nosebleeds and sore throat.   Eyes: Negative for blurred vision, double vision, photophobia, pain, discharge and redness.  Respiratory: Negative for cough, sputum production (in am), shortness of breath and wheezing.   Cardiovascular: Positive for leg swelling. Negative for chest pain, palpitations, orthopnea, claudication and PND.       BLE-mainly in ankles L>R  Gastrointestinal: Negative for heartburn, nausea, vomiting, abdominal pain, diarrhea, constipation and blood in stool.  Genitourinary: Positive for frequency. Negative for dysuria, urgency, hematuria and flank pain.  Musculoskeletal: Positive for joint pain. Negative for back pain, falls, myalgias and neck pain.  Skin: Positive for itching. Negative for rash.       chronic  Neurological: Negative  for dizziness, tingling, tremors, sensory change, speech change, focal weakness, seizures, loss of consciousness, weakness and headaches.  Endo/Heme/Allergies: Negative for environmental allergies and polydipsia. Does not bruise/bleed easily.  Psychiatric/Behavioral: Positive for depression and memory loss. Negative for hallucinations. The patient is not nervous/anxious and does not have insomnia.      Past Medical History  Diagnosis Date  . GERD (gastroesophageal reflux disease)   . Allergy    Medications: Patient's Medications  New Prescriptions   No medications on file  Previous Medications   CALCIUM CARB-CHOLECALCIFEROL (CALCIUM 600 + D) 600-200 MG-UNIT TABS    Take by mouth daily.   DONEPEZIL (ARICEPT) 10 MG TABLET    Take 10 mg by mouth daily.   LORATADINE (CLARITIN) 10 MG TABLET    Take 10 mg by mouth daily.   PSYLLIUM (REGULOID) 0.52 G CAPSULE    Take 0.52 g by mouth daily.   PYRIDOXINE (VITAMIN B-6) 100 MG TABLET    Take 100 mg by mouth daily.   VITAMIN B-12 (CYANOCOBALAMIN) 1000 MCG TABLET    Take 1,000 mcg by mouth daily.  Modified Medications   No medications on file  Discontinued Medications   No medications on file     Physical Exam: Physical Exam  Constitutional: He appears well-developed and well-nourished. No distress.  HENT:  Head: Normocephalic and atraumatic.  Nose: Nose normal.  Mouth/Throat: Oropharynx is clear and moist. No oropharyngeal exudate.  Eyes: Conjunctivae and EOM are normal. Pupils are equal, round, and reactive to light. Right eye exhibits no  discharge. Left eye exhibits no discharge. No scleral icterus.  Neck: Normal range of motion. Neck supple. No JVD present. No thyromegaly present.  Cardiovascular: Normal rate, regular rhythm, normal heart sounds and intact distal pulses.   No murmur heard. Pulmonary/Chest: Effort normal and breath sounds normal. No respiratory distress. He has no wheezes. He has no rales.  Abdominal: Soft. Bowel sounds  are normal. He exhibits no distension. There is no tenderness. There is no rebound.  Musculoskeletal: Normal range of motion. He exhibits edema. He exhibits no tenderness.  BLE mainly in ankles, L>R. The left pedis dorsalis pulse R>L  Lymphadenopathy:    He has no cervical adenopathy.  Neurological: He is alert. He has normal reflexes. No cranial nerve deficit. He exhibits normal muscle tone. Coordination normal.  Skin: Skin is warm and dry. Rash (chest, upper back, arms, neck and head/face-dry, scaly, with mild diffuse eczema ) noted. He is not diaphoretic. No erythema. No pallor.     Psychiatric: His mood appears not anxious. His affect is blunt, labile and inappropriate. His affect is not angry. His speech is not rapid and/or pressured, not delayed, not tangential and not slurred. He is slowed. He is not agitated, not aggressive, not hyperactive, not withdrawn, not actively hallucinating and not combative. Thought content is not paranoid and not delusional. Cognition and memory are impaired. He expresses impulsivity and inappropriate judgment. He exhibits a depressed mood. He is communicative. He exhibits abnormal recent memory and abnormal remote memory. He is attentive.    Filed Vitals:   10/05/13 1057  BP: 122/68  Pulse: 88  Temp: 96.8 F (36 C)  TempSrc: Tympanic  Resp: 20      Labs reviewed: Basic Metabolic Panel:  Recent Labs  16/02/9605/12/14 07/16/13  NA 139 142  K 4.4 4.1  BUN 10 16  CREATININE 1.0 1.0  TSH 3.32  --    Liver Function Tests:  Recent Labs  10/28/12  AST 17  ALT 16  ALKPHOS 70   CBC:  Recent Labs  10/28/12 07/16/13  WBC 7.3 7.0  HGB 12.7* 13.3*  HCT 37* 39*  PLT 249 274   Lipid Panel:  Recent Labs  11/23/12  CHOL 146  HDL 37  LDLCALC 89  TRIG 101   Assessment/Plan Edema extremities Chronic trace edema BLE. No cough, sputum production or paroxysmal nocturnal orthopnea. Continue compression hosiery and monitor the patient.     Alzheimer's disease takes Aricept, functioning well in Memory Care Unit, well adjusted. MMSE 9/30 03/09/12    Dry skin dermatitis moisturizer and Claritin 10mg  daily. Diffused scratching injuries and eczema upper body and legs-better    Unspecified constipation Stable on Fiber Laxative II daily.      Family/ Staff Communication: observe the patient  Goals of Care: SNF  Labs/tests ordered: none

## 2013-10-05 NOTE — Assessment & Plan Note (Addendum)
Chronic trace edema BLE. No cough, sputum production or paroxysmal nocturnal orthopnea. May dc compression hosiery and monitor the patient.

## 2013-10-05 NOTE — Assessment & Plan Note (Signed)
takes Aricept, functioning well in Memory Care Unit, well adjusted. MMSE 9/30 03/09/12   

## 2013-10-05 NOTE — Assessment & Plan Note (Signed)
moisturizer and Claritin 10mg  daily. Diffused scratching injuries and eczema upper body and legs-better

## 2013-10-05 NOTE — Assessment & Plan Note (Signed)
Stable on Fiber Laxative II daily.

## 2013-11-03 LAB — LIPID PANEL
CHOLESTEROL: 180 mg/dL (ref 0–200)
HDL: 42 mg/dL (ref 35–70)
LDL Cholesterol: 124 mg/dL
Triglycerides: 68 mg/dL (ref 40–160)

## 2013-11-14 ENCOUNTER — Non-Acute Institutional Stay (SKILLED_NURSING_FACILITY): Payer: Medicare Other | Admitting: Nurse Practitioner

## 2013-11-14 ENCOUNTER — Encounter: Payer: Self-pay | Admitting: Nurse Practitioner

## 2013-11-14 DIAGNOSIS — R040 Epistaxis: Secondary | ICD-10-CM

## 2013-11-14 DIAGNOSIS — R609 Edema, unspecified: Secondary | ICD-10-CM

## 2013-11-14 DIAGNOSIS — L258 Unspecified contact dermatitis due to other agents: Secondary | ICD-10-CM

## 2013-11-14 DIAGNOSIS — K5900 Constipation, unspecified: Secondary | ICD-10-CM

## 2013-11-14 DIAGNOSIS — L853 Xerosis cutis: Secondary | ICD-10-CM

## 2013-11-14 DIAGNOSIS — F0280 Dementia in other diseases classified elsewhere without behavioral disturbance: Secondary | ICD-10-CM

## 2013-11-14 DIAGNOSIS — K219 Gastro-esophageal reflux disease without esophagitis: Secondary | ICD-10-CM

## 2013-11-14 DIAGNOSIS — G309 Alzheimer's disease, unspecified: Secondary | ICD-10-CM

## 2013-11-14 DIAGNOSIS — E785 Hyperlipidemia, unspecified: Secondary | ICD-10-CM

## 2013-11-14 DIAGNOSIS — R600 Localized edema: Secondary | ICD-10-CM

## 2013-11-14 NOTE — Assessment & Plan Note (Signed)
Chronic trace edema BLE. No cough, sputum production or paroxysmal nocturnal orthopnea. Continue compression hosiery and monitor the patient

## 2013-11-14 NOTE — Assessment & Plan Note (Signed)
moisturizer and Claritin 10mg daily. Diffused scratching injuries and eczema upper body and legs-better   

## 2013-11-14 NOTE — Assessment & Plan Note (Signed)
Much improved on Nexium 20mg daily for 4 weeks. Currently is off.     

## 2013-11-14 NOTE — Assessment & Plan Note (Signed)
11/03/13 LDL 124-continue diet control.

## 2013-11-14 NOTE — Assessment & Plan Note (Signed)
takes Aricept, functioning well in Memory Care Unit, well adjusted. MMSE 9/30 03/09/12

## 2013-11-14 NOTE — Progress Notes (Signed)
Patient ID: Aaron OvermanWilliam Howton, male   DOB: Aug 11, 1930, 80 y.o.   MRN: [id number]   Code Status: DNR  Allergies  Allergen Reactions  . Namenda [Memantine Hcl]   . Penicillins     Chief Complaint  Patient presents with  . Medical Management of Chronic Issues  . Acute Visit    constipation    HPI: Patient is a 80 y.o. male seen [facility] today for evaluation of constipation and chronic medical conditions.  Problem List Items Addressed This Visit   GERD (gastroesophageal reflux disease)     Much improved on Nexium 20mg  daily for 4 weeks. Currently is off.       Other and unspecified hyperlipidemia - Primary     11/03/13 LDL 124-continue diet control.     Alzheimer's disease     takes Aricept, functioning well in Memory Care Unit, well adjusted. MMSE 9/30 03/09/12      Unspecified constipation     More than on occasion noted that the patient has difficulty passing stools-straining-Metamucil is not adequate. 11/06/13-fecal impaction which was resolved after MiraLax q2h until BM then Senokot S II qhs.     Dry skin dermatitis     moisturizer and Claritin 10mg  daily. Diffused scratching injuries and eczema upper body and legs-better      Edema extremities     Chronic trace edema BLE. No cough, sputum production or paroxysmal nocturnal orthopnea. Continue compression hosiery and monitor the patient    Epistaxis     No further nose bleed since off Plavix.         Review of Systems:  Review of Systems  Constitutional: Negative for fever, chills, weight loss, malaise/fatigue and diaphoresis.  HENT: Positive for hearing loss. Negative for congestion, ear discharge, nosebleeds and sore throat.   Eyes: Negative for blurred vision, double vision, photophobia, pain, discharge and redness.  Respiratory: Negative for cough, sputum production (in am), shortness of breath and wheezing.   Cardiovascular: Positive for leg swelling. Negative for chest pain,  palpitations, orthopnea, claudication and PND.       BLE-mainly in ankles L>R  Gastrointestinal: Positive for constipation. Negative for heartburn, nausea, vomiting, abdominal pain, diarrhea and blood in stool.  Genitourinary: Positive for frequency. Negative for dysuria, urgency, hematuria and flank pain.  Musculoskeletal: Positive for joint pain. Negative for back pain, falls, myalgias and neck pain.  Skin: Positive for itching. Negative for rash.       chronic  Neurological: Negative for dizziness, tingling, tremors, sensory change, speech change, focal weakness, seizures, loss of consciousness, weakness and headaches.  Endo/Heme/Allergies: Negative for environmental allergies and polydipsia. Does not bruise/bleed easily.  Psychiatric/Behavioral: Positive for depression and memory loss. Negative for hallucinations. The patient is not nervous/anxious and does not have insomnia.      Past Medical History  Diagnosis Date  . GERD (gastroesophageal reflux disease)   . Allergy    Medications: Patient's Medications  New Prescriptions   No medications on file  Previous Medications   CALCIUM CARB-CHOLECALCIFEROL (CALCIUM 600 + D) 600-200 MG-UNIT TABS    Take by mouth daily.   DONEPEZIL (ARICEPT) 10 MG TABLET    Take 10 mg by mouth daily.   LORATADINE (CLARITIN) 10 MG TABLET    Take 10 mg by mouth daily.   PSYLLIUM (REGULOID) 0.52 G CAPSULE    Take 0.52 g by mouth daily.   PYRIDOXINE (VITAMIN B-6) 100 MG TABLET    Take 100 mg by mouth  daily.   VITAMIN B-12 (CYANOCOBALAMIN) 1000 MCG TABLET    Take 1,000 mcg by mouth daily.  Modified Medications   No medications on file  Discontinued Medications   No medications on file     Physical Exam: Physical Exam  Constitutional: He appears well-developed and well-nourished. No distress.  HENT:  Head: Normocephalic and atraumatic.  Nose: Nose normal.  Mouth/Throat: Oropharynx is clear and moist. No oropharyngeal exudate.  Eyes: Conjunctivae  and EOM are normal. Pupils are equal, round, and reactive to light. Right eye exhibits no discharge. Left eye exhibits no discharge. No scleral icterus.  Neck: Normal range of motion. Neck supple. No JVD present. No thyromegaly present.  Cardiovascular: Normal rate, regular rhythm, normal heart sounds and intact distal pulses.   No murmur heard. Pulmonary/Chest: Effort normal and breath sounds normal. No respiratory distress. He has no wheezes. He has no rales.  Abdominal: Soft. Bowel sounds are normal. He exhibits no distension. There is no tenderness. There is no rebound.  Musculoskeletal: Normal range of motion. He exhibits edema. He exhibits no tenderness.  BLE mainly in ankles, L>R. The left pedis dorsalis pulse R>L  Lymphadenopathy:    He has no cervical adenopathy.  Neurological: He is alert. He has normal reflexes. No cranial nerve deficit. He exhibits normal muscle tone. Coordination normal.  Skin: Skin is warm and dry. Rash (chest, upper back, arms, neck and head/face-dry, scaly, with mild diffuse eczema ) noted. He is not diaphoretic. No erythema. No pallor.     Psychiatric: His mood appears not anxious. His affect is blunt, labile and inappropriate. His affect is not angry. His speech is not rapid and/or pressured, not delayed, not tangential and not slurred. He is slowed. He is not agitated, not aggressive, not hyperactive, not withdrawn, not actively hallucinating and not combative. Thought content is not paranoid and not delusional. Cognition and memory are impaired. He expresses impulsivity and inappropriate judgment. He exhibits a depressed mood. He is communicative. He exhibits abnormal recent memory and abnormal remote memory. He is attentive.    Filed Vitals:   11/14/13 1751  BP: 128/70  Pulse: 80  Temp: 97.8 F (36.6 C)  TempSrc: Tympanic  Resp: 20      Labs reviewed: Basic Metabolic Panel:  Recent Labs  16/10/96  NA 142  K 4.1  BUN 16  CREATININE 1.0    Liver Function Tests: No results found for this basename: AST, ALT, ALKPHOS, BILITOT, PROT, ALBUMIN,  in the last 8760 hours CBC:  Recent Labs  07/16/13  WBC 7.0  HGB 13.3*  HCT 39*  PLT 274   Lipid Panel:  Recent Labs  11/23/12 11/03/13  CHOL 146 180  HDL 37 42  LDLCALC 89 124  TRIG 101 68   Assessment/Plan Other and unspecified hyperlipidemia 11/03/13 LDL 124-continue diet control.   Unspecified constipation More than on occasion noted that the patient has difficulty passing stools-straining-Metamucil is not adequate. 11/06/13-fecal impaction which was resolved after MiraLax q2h until BM then Senokot S II qhs.   Dry skin dermatitis moisturizer and Claritin 10mg  daily. Diffused scratching injuries and eczema upper body and legs-better    Alzheimer's disease takes Aricept, functioning well in Memory Care Unit, well adjusted. MMSE 9/30 03/09/12    GERD (gastroesophageal reflux disease) Much improved on Nexium 20mg  daily for 4 weeks. Currently is off.     Edema extremities Chronic trace edema BLE. No cough, sputum production or paroxysmal nocturnal orthopnea. Continue compression hosiery and monitor the patient  Epistaxis No further nose bleed since off Plavix.      Family/ Staff Communication: observe the patient  Goals of Care: SNF  Labs/tests ordered: none

## 2013-11-14 NOTE — Assessment & Plan Note (Signed)
No further nose bleed since off Plavix.

## 2013-11-14 NOTE — Assessment & Plan Note (Signed)
More than on occasion noted that the patient has difficulty passing stools-straining-Metamucil is not adequate. 11/06/13-fecal impaction which was resolved after MiraLax q2h until BM then Senokot S II qhs.

## 2014-01-11 ENCOUNTER — Encounter: Payer: Self-pay | Admitting: Nurse Practitioner

## 2014-01-11 ENCOUNTER — Non-Acute Institutional Stay (SKILLED_NURSING_FACILITY): Payer: Medicare Other | Admitting: Nurse Practitioner

## 2014-01-11 DIAGNOSIS — K5900 Constipation, unspecified: Secondary | ICD-10-CM

## 2014-01-11 DIAGNOSIS — L853 Xerosis cutis: Secondary | ICD-10-CM

## 2014-01-11 DIAGNOSIS — L258 Unspecified contact dermatitis due to other agents: Secondary | ICD-10-CM

## 2014-01-11 DIAGNOSIS — G309 Alzheimer's disease, unspecified: Principal | ICD-10-CM

## 2014-01-11 DIAGNOSIS — F0280 Dementia in other diseases classified elsewhere without behavioral disturbance: Secondary | ICD-10-CM

## 2014-01-11 NOTE — Assessment & Plan Note (Addendum)
takes Aricept, functioning well in Memory Care Unit, well adjusted. MMSE 9/30 03/09/12-12/15/13 06/01/12. Easily re-directed. Not combative.

## 2014-01-11 NOTE — Assessment & Plan Note (Signed)
stable-takes Metamucil and prn Senokot S prn   

## 2014-01-11 NOTE — Assessment & Plan Note (Signed)
moisturizer and Claritin 10mg daily. Diffused scratching injuries and eczema upper body and legs-better   

## 2014-01-11 NOTE — Progress Notes (Signed)
Patient ID: Aaron Farmer, male   DOB: [birth year], 80 y.o.   MRN: [id number]   Code Status: DNR  Allergies  Allergen Reactions  . Namenda [Memantine Hcl]   . Penicillins     Chief Complaint  Patient presents with  . Medical Management of Chronic Issues    HPI: Patient is a 80 y.o. male seen in the SNF at [facility] today for evaluation of chronic medical conditions.  Problem List Items Addressed This Visit   Alzheimer's disease - Primary     takes Aricept, functioning well in Memory Care Unit, well adjusted. MMSE 9/30 03/09/12-12/15/13 06/01/12. Easily re-directed. Not combative.       Dry skin dermatitis     moisturizer and Claritin  daily. Diffused scratching injuries and eczema upper body and legs-better      Unspecified constipation     stable-takes Metamucil and prn Senokot S prn        Review of Systems:  Review of Systems  Constitutional: Negative for fever, chills, weight loss, malaise/fatigue and diaphoresis.  HENT: Positive for hearing loss. Negative for congestion, ear discharge, nosebleeds and sore throat.   Eyes: Negative for blurred vision, double vision, photophobia, pain, discharge and redness.  Respiratory: Negative for cough, sputum production (in am), shortness of breath and wheezing.   Cardiovascular: Positive for leg swelling. Negative for chest pain, palpitations, orthopnea, claudication and PND.       BLE-mainly in ankles L>R  Gastrointestinal: Positive for constipation. Negative for heartburn, nausea, vomiting, abdominal pain, diarrhea and blood in stool.  Genitourinary: Positive for frequency. Negative for dysuria, urgency, hematuria and flank pain.  Musculoskeletal: Positive for joint pain. Negative for back pain, falls, myalgias and neck pain.  Skin: Positive for itching. Negative for rash.       chronic  Neurological: Negative for dizziness, tingling, tremors, sensory change, speech change, focal weakness, seizures, loss of  consciousness, weakness and headaches.  Endo/Heme/Allergies: Negative for environmental allergies and polydipsia. Does not bruise/bleed easily.  Psychiatric/Behavioral: Positive for depression and memory loss. Negative for hallucinations. The patient is not nervous/anxious and does not have insomnia.      Past Medical History  Diagnosis Date  . GERD (gastroesophageal reflux disease)   . Allergy    Medications: Patient's Medications  New Prescriptions   No medications on file  Previous Medications   CALCIUM CARB-CHOLECALCIFEROL (CALCIUM 600 + D) 600-200 MG-UNIT TABS    Take by mouth daily.   DONEPEZIL (ARICEPT) 10 MG TABLET    Take 10 mg by mouth daily.   LORATADINE (CLARITIN) 10 MG TABLET    Take 10 mg by mouth daily.   PSYLLIUM (REGULOID) 0.52 G CAPSULE    Take 0.52 g by mouth daily.   PYRIDOXINE (VITAMIN B-6) 100 MG TABLET    Take 100 mg by mouth daily.   VITAMIN B-12 (CYANOCOBALAMIN) 1000 MCG TABLET    Take 1,000 mcg by mouth daily.  Modified Medications   No medications on file  Discontinued Medications   No medications on file     Physical Exam: Physical Exam  Constitutional: He appears well-developed and well-nourished. No distress.  HENT:  Head: Normocephalic and atraumatic.  Nose: Nose normal.  Mouth/Throat: Oropharynx is clear and moist. No oropharyngeal exudate.  Eyes: Conjunctivae and EOM are normal. Pupils are equal, round, and reactive to light. Right eye exhibits no discharge. Left eye exhibits no discharge. No scleral icterus.  Neck: Normal range of motion. Neck supple. No JVD present. No thyromegaly  present.  Cardiovascular: Normal rate, regular rhythm, normal heart sounds and intact distal pulses.   No murmur heard. Pulmonary/Chest: Effort normal and breath sounds normal. No respiratory distress. He has no wheezes. He has no rales.  Abdominal: Soft. Bowel sounds are normal. He exhibits no distension. There is no tenderness. There is no rebound.    Musculoskeletal: Normal range of motion. He exhibits edema. He exhibits no tenderness.  BLE mainly in ankles, L>R. The left pedis dorsalis pulse R>L  Lymphadenopathy:    He has no cervical adenopathy.  Neurological: He is alert. He has normal reflexes. No cranial nerve deficit. He exhibits normal muscle tone. Coordination normal.  Skin: Skin is warm and dry. Rash (chest, upper back, arms, neck and head/face-dry, scaly, with mild diffuse eczema ) noted. He is not diaphoretic. No erythema. No pallor.     Psychiatric: His mood appears not anxious. His affect is blunt, labile and inappropriate. His affect is not angry. His speech is not rapid and/or pressured, not delayed, not tangential and not slurred. He is slowed. He is not agitated, not aggressive, not hyperactive, not withdrawn, not actively hallucinating and not combative. Thought content is not paranoid and not delusional. Cognition and memory are impaired. He expresses impulsivity and inappropriate judgment. He exhibits a depressed mood. He is communicative. He exhibits abnormal recent memory and abnormal remote memory. He is attentive.    Filed Vitals:   01/11/14 1221  BP: 136/70  Pulse: 60  Temp: 96.6 F (35.9 C)  TempSrc: Tympanic  Resp: 16      Labs reviewed: Basic Metabolic Panel:  Recent Labs  16/10/96  NA 142  K 4.1  BUN 16  CREATININE 1.0   Liver Function Tests: No results found for this basename: AST, ALT, ALKPHOS, BILITOT, PROT, ALBUMIN,  in the last 8760 hours CBC:  Recent Labs  07/16/13  WBC 7.0  HGB 13.3*  HCT 39*  PLT 274   Lipid Panel:  Recent Labs  11/03/13  CHOL 180  HDL 42  LDLCALC 124  TRIG 68   Assessment/Plan Unspecified constipation stable-takes Metamucil and prn Senokot S prn   Alzheimer's disease takes Aricept, functioning well in Memory Care Unit, well adjusted. MMSE 9/30 03/09/12-12/15/13 06/01/12. Easily re-directed. Not combative.     Dry skin dermatitis moisturizer and  Claritin  daily. Diffused scratching injuries and eczema upper body and legs-better      Family/ Staff Communication: observe the patient  Goals of Care: SNF  Labs/tests ordered: none

## 2014-03-16 ENCOUNTER — Encounter: Payer: Self-pay | Admitting: Nurse Practitioner

## 2014-03-16 ENCOUNTER — Non-Acute Institutional Stay (SKILLED_NURSING_FACILITY): Payer: Medicare Other | Admitting: Nurse Practitioner

## 2014-03-16 DIAGNOSIS — K5900 Constipation, unspecified: Secondary | ICD-10-CM

## 2014-03-16 DIAGNOSIS — G309 Alzheimer's disease, unspecified: Secondary | ICD-10-CM

## 2014-03-16 DIAGNOSIS — F0280 Dementia in other diseases classified elsewhere without behavioral disturbance: Secondary | ICD-10-CM

## 2014-03-16 DIAGNOSIS — R600 Localized edema: Secondary | ICD-10-CM

## 2014-03-16 DIAGNOSIS — R609 Edema, unspecified: Secondary | ICD-10-CM

## 2014-03-16 NOTE — Assessment & Plan Note (Signed)
Chronic, worsen edema BLE from trace to 1+. No cough, sputum production or paroxysmal nocturnal orthopnea. Will try Furosemide 20mg  Kcl 20meq po daily, update BMP in one week.

## 2014-03-16 NOTE — Assessment & Plan Note (Signed)
takes Aricept, functioning well in Memory Care Unit, well adjusted. MMSE 9/30 03/09/12   

## 2014-03-16 NOTE — Progress Notes (Signed)
Patient ID: Aaron Farmer, male   DOB: 1930-12-20, 80 y.o.   MRN: [id number]   Code Status: DNR  Allergies  Allergen Reactions  . Namenda [Memantine Hcl]   . Penicillins     Chief Complaint  Patient presents with  . Medical Management of Chronic Issues  . Acute Visit    edema BLE    HPI: Patient is a 80 y.o. male [facility] today for evaluation of BLE edema and chronic medical conditions.  Problem List Items Addressed This Visit   Edema extremities - Primary     Chronic, worsen edema BLE from trace to 1+. No cough, sputum production or paroxysmal nocturnal orthopnea. Will try Furosemide 20mg  Kcl 20meq po daily, update BMP in one week.     Constipation     stable-takes Metamucil and prn Senokot S prn      Alzheimer's disease     takes Aricept, functioning well in Memory Care Unit, well adjusted. MMSE 9/30 03/09/12          Review of Systems:  Review of Systems  Constitutional: Negative for fever, chills, weight loss, malaise/fatigue and diaphoresis.  HENT: Positive for hearing loss. Negative for congestion, ear discharge, nosebleeds and sore throat.   Eyes: Negative for blurred vision, double vision, photophobia, pain, discharge and redness.  Respiratory: Negative for cough, sputum production (in am), shortness of breath and wheezing.   Cardiovascular: Positive for leg swelling. Negative for chest pain, palpitations, orthopnea, claudication and PND.       BLE worse 1+  Gastrointestinal: Positive for constipation. Negative for heartburn, nausea, vomiting, abdominal pain, diarrhea and blood in stool.  Genitourinary: Positive for frequency. Negative for dysuria, urgency, hematuria and flank pain.  Musculoskeletal: Positive for joint pain. Negative for back pain, falls, myalgias and neck pain.  Skin: Positive for itching. Negative for rash.       chronic  Neurological: Negative for dizziness, tingling, tremors, sensory change, speech  change, focal weakness, seizures, loss of consciousness, weakness and headaches.  Endo/Heme/Allergies: Negative for environmental allergies and polydipsia. Does not bruise/bleed easily.  Psychiatric/Behavioral: Positive for depression and memory loss. Negative for hallucinations. The patient is not nervous/anxious and does not have insomnia.      Past Medical History  Diagnosis Date  . GERD (gastroesophageal reflux disease)   . Allergy    Medications: Patient's Medications  New Prescriptions   No medications on file  Previous Medications   CALCIUM CARB-CHOLECALCIFEROL (CALCIUM 600 + D) 600-200 MG-UNIT TABS    Take by mouth daily.   DONEPEZIL (ARICEPT) 10 MG TABLET    Take 10 mg by mouth daily.   LORATADINE (CLARITIN) 10 MG TABLET    Take 10 mg by mouth daily.   PSYLLIUM (REGULOID) 0.52 G CAPSULE    Take 0.52 g by mouth daily.   PYRIDOXINE (VITAMIN B-6) 100 MG TABLET    Take 100 mg by mouth daily.   VITAMIN B-12 (CYANOCOBALAMIN) 1000 MCG TABLET    Take 1,000 mcg by mouth daily.  Modified Medications   No medications on file  Discontinued Medications   No medications on file     Physical Exam: Physical Exam  Constitutional: He appears well-developed and well-nourished. No distress.  HENT:  Head: Normocephalic and atraumatic.  Nose: Nose normal.  Mouth/Throat: Oropharynx is clear and moist. No oropharyngeal exudate.  Eyes: Conjunctivae and EOM are normal. Pupils are equal, round, and reactive to light. Right eye exhibits no discharge. Left eye  exhibits no discharge. No scleral icterus.  Neck: Normal range of motion. Neck supple. No JVD present. No thyromegaly present.  Cardiovascular: Normal rate, regular rhythm, normal heart sounds and intact distal pulses.   No murmur heard. Pulmonary/Chest: Effort normal. No respiratory distress. He has no wheezes. He has rales.  Mid to lower lung posteriorly.   Abdominal: Soft. Bowel sounds are normal. He exhibits no distension. There is no  tenderness. There is no rebound.  Musculoskeletal: Normal range of motion. He exhibits edema. He exhibits no tenderness.  BLE 1+  Lymphadenopathy:    He has no cervical adenopathy.  Neurological: He is alert. He has normal reflexes. No cranial nerve deficit. He exhibits normal muscle tone. Coordination normal.  Skin: Skin is warm and dry. Rash (chest, upper back, arms, neck and head/face-dry, scaly, with mild diffuse eczema ) noted. He is not diaphoretic. No erythema. No pallor.     Psychiatric: His mood appears not anxious. His affect is blunt, labile and inappropriate. His affect is not angry. His speech is not rapid and/or pressured, not delayed, not tangential and not slurred. He is slowed. He is not agitated, not aggressive, not hyperactive, not withdrawn, not actively hallucinating and not combative. Thought content is not paranoid and not delusional. Cognition and memory are impaired. He expresses impulsivity and inappropriate judgment. He exhibits a depressed mood. He is communicative. He exhibits abnormal recent memory and abnormal remote memory. He is attentive.    Filed Vitals:   03/16/14 1434  BP: 138/84  Pulse: 70  Temp: 98 F (36.7 C)  TempSrc: Tympanic  Resp: 18      Labs reviewed: Basic Metabolic Panel:  Recent Labs  40/98/1102/28/15  NA 142  K 4.1  BUN 16  CREATININE 1.0   Liver Function Tests: No results found for this basename: AST, ALT, ALKPHOS, BILITOT, PROT, ALBUMIN,  in the last 8760 hours CBC:  Recent Labs  07/16/13  WBC 7.0  HGB 13.3*  HCT 39*  PLT 274   Lipid Panel:  Recent Labs  11/03/13  CHOL 180  HDL 42  LDLCALC 124  TRIG 68   Assessment/Plan Edema extremities Chronic, worsen edema BLE from trace to 1+. No cough, sputum production or paroxysmal nocturnal orthopnea. Will try Furosemide 20mg  Kcl 20meq po daily, update BMP in one week.   Alzheimer's disease takes Aricept, functioning well in Memory Care Unit, well adjusted. MMSE 9/30  03/09/12     Constipation stable-takes Metamucil and prn Senokot S prn      Family/ Staff Communication: observe the patient  Goals of Care: SNF  Labs/tests ordered: BMP

## 2014-03-16 NOTE — Assessment & Plan Note (Signed)
stable-takes Metamucil and prn Senokot S prn

## 2014-03-23 LAB — BASIC METABOLIC PANEL
BUN: 12 mg/dL (ref 4–21)
Creatinine: 1.2 mg/dL (ref 0.6–1.3)
Glucose: 164 mg/dL
POTASSIUM: 4.6 mmol/L (ref 3.4–5.3)
Sodium: 140 mmol/L (ref 137–147)

## 2014-03-29 ENCOUNTER — Non-Acute Institutional Stay (SKILLED_NURSING_FACILITY): Payer: Medicare Other | Admitting: Nurse Practitioner

## 2014-03-29 ENCOUNTER — Encounter: Payer: Self-pay | Admitting: Nurse Practitioner

## 2014-03-29 DIAGNOSIS — E119 Type 2 diabetes mellitus without complications: Secondary | ICD-10-CM | POA: Insufficient documentation

## 2014-03-29 DIAGNOSIS — K5900 Constipation, unspecified: Secondary | ICD-10-CM

## 2014-03-29 DIAGNOSIS — R609 Edema, unspecified: Secondary | ICD-10-CM

## 2014-03-29 DIAGNOSIS — F0280 Dementia in other diseases classified elsewhere without behavioral disturbance: Secondary | ICD-10-CM

## 2014-03-29 DIAGNOSIS — R600 Localized edema: Secondary | ICD-10-CM

## 2014-03-29 DIAGNOSIS — G309 Alzheimer's disease, unspecified: Secondary | ICD-10-CM

## 2014-03-29 NOTE — Assessment & Plan Note (Signed)
stable-takes Metamucil and prn Senokot S prn   

## 2014-03-29 NOTE — Progress Notes (Signed)
Patient ID: Aaron OvermanWilliam Coverdale, male   DOB: 09/16/1930, 80 y.o.   MRN: [id number]   Code Status: DNR  Allergies  Allergen Reactions  . Namenda [Memantine Hcl]   . Penicillins     Chief Complaint  Patient presents with  . Medical Management of Chronic Issues  . Acute Visit    elevated blood sugar.     HPI: Patient is a 80 y.o. male seen in the SN[facility] at [facility] today for evaluation of elevated blood sugar, BLE edema and chronic medical conditions.  Problem List Items Addressed This Visit    Edema extremities    Chronic, improved edema BLE from 1+ to trace. No cough, sputum production or paroxysmal nocturnal orthopnea. Continue  Furosemide 20mg  Kcl 20meq po daily, update BMP in one week.     Diabetes mellitus type 2, noninsulin dependent - Primary    03/29/14 CBGs 187, 149, 169, 156, 169-will start Metformin 500mg  po daily with breakfast, continue CBG daily.     Constipation    stable-takes Metamucil and prn Senokot S prn      Alzheimer's disease    takes Aricept, functioning well in Memory Care Unit, well adjusted. MMSE 9/30 03/09/12           Review of Systems:  Review of Systems  Constitutional: Negative for fever, chills, weight loss, malaise/fatigue and diaphoresis.  HENT: Positive for hearing loss. Negative for congestion, ear discharge, nosebleeds and sore throat.   Eyes: Negative for blurred vision, double vision, photophobia, pain, discharge and redness.  Respiratory: Negative for cough, sputum production (in am), shortness of breath and wheezing.   Cardiovascular: Positive for leg swelling. Negative for chest pain, palpitations, orthopnea, claudication and PND.       BLE trace  Gastrointestinal: Positive for constipation. Negative for heartburn, nausea, vomiting, abdominal pain, diarrhea and blood in stool.  Genitourinary: Positive for frequency. Negative for dysuria, urgency, hematuria and flank pain.  Musculoskeletal: Positive for joint pain.  Negative for myalgias, back pain, falls and neck pain.  Skin: Positive for itching. Negative for rash.       chronic  Neurological: Negative for dizziness, tingling, tremors, sensory change, speech change, focal weakness, seizures, loss of consciousness, weakness and headaches.  Endo/Heme/Allergies: Negative for environmental allergies and polydipsia. Does not bruise/bleed easily.  Psychiatric/Behavioral: Positive for depression and memory loss. Negative for hallucinations. The patient is not nervous/anxious and does not have insomnia.      Past Medical History  Diagnosis Date  . GERD (gastroesophageal reflux disease)   . Allergy    Medications: Patient's Medications  New Prescriptions   No medications on file  Previous Medications   CALCIUM CARB-CHOLECALCIFEROL (CALCIUM 600 + D) 600-200 MG-UNIT TABS    Take by mouth daily.   DONEPEZIL (ARICEPT) 10 MG TABLET    Take 10 mg by mouth daily.   LORATADINE (CLARITIN) 10 MG TABLET    Take 10 mg by mouth daily.   PSYLLIUM (REGULOID) 0.52 G CAPSULE    Take 0.52 g by mouth daily.   PYRIDOXINE (VITAMIN B-6) 100 MG TABLET    Take 100 mg by mouth daily.   VITAMIN B-12 (CYANOCOBALAMIN) 1000 MCG TABLET    Take 1,000 mcg by mouth daily.  Modified Medications   No medications on file  Discontinued Medications   No medications on file     Physical Exam: Physical Exam  Constitutional: He appears well-developed and well-nourished. No distress.  HENT:  Head: Normocephalic and atraumatic.  Nose: Nose  normal.  Mouth/Throat: Oropharynx is clear and moist. No oropharyngeal exudate.  Eyes: Conjunctivae and EOM are normal. Pupils are equal, round, and reactive to light. Right eye exhibits no discharge. Left eye exhibits no discharge. No scleral icterus.  Neck: Normal range of motion. Neck supple. No JVD present. No thyromegaly present.  Cardiovascular: Normal rate, regular rhythm, normal heart sounds and intact distal pulses.   No murmur  heard. Pulmonary/Chest: Effort normal. No respiratory distress. He has no wheezes. He has rales.  Mid to lower lung posteriorly.   Abdominal: Soft. Bowel sounds are normal. He exhibits no distension. There is no tenderness. There is no rebound.  Musculoskeletal: Normal range of motion. He exhibits edema. He exhibits no tenderness.  BLE trace  Lymphadenopathy:    He has no cervical adenopathy.  Neurological: He is alert. He has normal reflexes. No cranial nerve deficit. He exhibits normal muscle tone. Coordination normal.  Skin: Skin is warm and dry. Rash (chest, upper back, arms, neck and head/face-dry, scaly, with mild diffuse eczema ) noted. He is not diaphoretic. No erythema. No pallor.     Psychiatric: His mood appears not anxious. His affect is blunt, labile and inappropriate. His affect is not angry. His speech is not rapid and/or pressured, not delayed, not tangential and not slurred. He is slowed. He is not agitated, not aggressive, not hyperactive, not withdrawn, not actively hallucinating and not combative. Thought content is not paranoid and not delusional. Cognition and memory are impaired. He expresses impulsivity and inappropriate judgment. He exhibits a depressed mood. He is communicative. He exhibits abnormal recent memory and abnormal remote memory. He is attentive.    Filed Vitals:   03/29/14 1426  BP: 122/56  Pulse: 68  Temp: 98 F (36.7 C)  TempSrc: Tympanic  Resp: 18      Labs reviewed: Basic Metabolic Panel:  Recent Labs  95/62/1302/28/15 03/23/14  NA 142 140  K 4.1 4.6  BUN 16 12  CREATININE 1.0 1.2   Liver Function Tests: No results for input(s): AST, ALT, ALKPHOS, BILITOT, PROT, ALBUMIN in the last 8760 hours. CBC:  Recent Labs  07/16/13  WBC 7.0  HGB 13.3*  HCT 39*  PLT 274   Lipid Panel:  Recent Labs  11/03/13  CHOL 180  HDL 42  LDLCALC 124  TRIG 68   Assessment/Plan Diabetes mellitus type 2, noninsulin dependent 03/29/14 CBGs 187, 149,  169, 156, 169-will start Metformin 500mg  po daily with breakfast, continue CBG daily.   Alzheimer's disease takes Aricept, functioning well in Memory Care Unit, well adjusted. MMSE 9/30 03/09/12      Constipation stable-takes Metamucil and prn Senokot S prn    Edema extremities Chronic, improved edema BLE from 1+ to trace. No cough, sputum production or paroxysmal nocturnal orthopnea. Continue  Furosemide 20mg  Kcl 20meq po daily, update BMP in one week.     Family/ Staff Communication: observe the patient  Goals of Care: SNF  Labs/tests ordered: BMP

## 2014-03-29 NOTE — Assessment & Plan Note (Signed)
03/29/14 CBGs 187, 149, 169, 156, 169-will start Metformin 500mg  po daily with breakfast, continue CBG daily.

## 2014-03-29 NOTE — Assessment & Plan Note (Signed)
Chronic, improved edema BLE from 1+ to trace. No cough, sputum production or paroxysmal nocturnal orthopnea. Continue  Furosemide 20mg  Kcl 20meq po daily, update BMP in one week.

## 2014-03-29 NOTE — Assessment & Plan Note (Signed)
takes Aricept, functioning well in Memory Care Unit, well adjusted. MMSE 9/30 03/09/12   

## 2014-04-04 LAB — BASIC METABOLIC PANEL
BUN: 17 mg/dL (ref 4–21)
Creatinine: 1.2 mg/dL (ref 0.6–1.3)
GLUCOSE: 157 mg/dL
POTASSIUM: 4.4 mmol/L (ref 3.4–5.3)
SODIUM: 143 mmol/L (ref 137–147)

## 2014-04-05 ENCOUNTER — Other Ambulatory Visit: Payer: Self-pay | Admitting: Nurse Practitioner

## 2014-04-05 DIAGNOSIS — E1121 Type 2 diabetes mellitus with diabetic nephropathy: Secondary | ICD-10-CM

## 2014-04-26 ENCOUNTER — Encounter: Payer: Self-pay | Admitting: Nurse Practitioner

## 2014-04-26 ENCOUNTER — Non-Acute Institutional Stay (SKILLED_NURSING_FACILITY): Payer: Medicare Other | Admitting: Nurse Practitioner

## 2014-04-26 DIAGNOSIS — R5382 Chronic fatigue, unspecified: Secondary | ICD-10-CM

## 2014-04-26 DIAGNOSIS — K5900 Constipation, unspecified: Secondary | ICD-10-CM

## 2014-04-26 DIAGNOSIS — F0280 Dementia in other diseases classified elsewhere without behavioral disturbance: Secondary | ICD-10-CM

## 2014-04-26 DIAGNOSIS — R5383 Other fatigue: Secondary | ICD-10-CM | POA: Insufficient documentation

## 2014-04-26 DIAGNOSIS — G309 Alzheimer's disease, unspecified: Secondary | ICD-10-CM

## 2014-04-26 DIAGNOSIS — R609 Edema, unspecified: Secondary | ICD-10-CM

## 2014-04-26 DIAGNOSIS — E0821 Diabetes mellitus due to underlying condition with diabetic nephropathy: Secondary | ICD-10-CM

## 2014-04-26 DIAGNOSIS — R600 Localized edema: Secondary | ICD-10-CM

## 2014-04-26 DIAGNOSIS — E119 Type 2 diabetes mellitus without complications: Secondary | ICD-10-CM

## 2014-04-26 NOTE — Assessment & Plan Note (Signed)
04/04/14 Bun/creatinine 17/1.2

## 2014-04-26 NOTE — Assessment & Plan Note (Signed)
Chronic, improved edema BLE from 1+ to trace. No cough, sputum production or paroxysmal nocturnal orthopnea. Continue  Furosemide 20mg  Kcl 20meq po daily

## 2014-04-26 NOTE — Assessment & Plan Note (Signed)
takes Aricept, functioning well in Memory Care Unit, well adjusted. MMSE 9/30 03/09/12   

## 2014-04-26 NOTE — Assessment & Plan Note (Signed)
Multiple factorials: recent Furosemide and generalized weakness are contributory. Last episode 04/19/14. Observe.

## 2014-04-26 NOTE — Assessment & Plan Note (Signed)
stable-takes Metamucil and prn Senokot S prn   

## 2014-04-26 NOTE — Assessment & Plan Note (Signed)
03/29/14 CBGs 187, 149, 169, 156, 169-will start Metformin 500mg  po daily with breakfast, continue CBG daily 04/26/14 no hypoglycemic episodes so far.

## 2014-04-26 NOTE — Progress Notes (Signed)
Patient ID: Aaron Farmer, male   DOB: 1930/05/26, 80 y.o.   MRN: [id number]   Code Status: DNR  Allergies  Allergen Reactions  . Namenda [Memantine Hcl]   . Penicillins     Chief Complaint  Patient presents with  . Medical Management of Chronic Issues  . Acute Visit    unsteadiness 04/19/14    HPI: Patient is a 80 y.o. male [facility] in [facility] SN[facility] today for evaluation of unsteadiness and chronic medical conditions.  Problem List Items Addressed This Visit    Fatigue    Multiple factorials: recent Furosemide and generalized weakness are contributory. Last episode 04/19/14. Observe.     Edema extremities    Chronic, improved edema BLE from 1+ to trace. No cough, sputum production or paroxysmal nocturnal orthopnea. Continue  Furosemide 20mg  Kcl 20meq po daily    Diabetic renal disease    04/04/14 Bun/creatinine 17/1.2     Diabetes mellitus type 2, noninsulin dependent    03/29/14 CBGs 187, 149, 169, 156, 169-will start Metformin 500mg  po daily with breakfast, continue CBG daily 04/26/14 no hypoglycemic episodes so far.     Constipation    stable-takes Metamucil and prn Senokot S prn      Alzheimer's disease - Primary    takes Aricept, functioning well in Memory Care Unit, well adjusted. MMSE 9/30 03/09/12           Review of Systems:  Review of Systems  Constitutional: Negative for fever, chills, weight loss, malaise/fatigue and diaphoresis.       Frail, unsteady  HENT: Positive for hearing loss. Negative for congestion, ear discharge, nosebleeds and sore throat.   Eyes: Negative for blurred vision, double vision, photophobia, pain, discharge and redness.  Respiratory: Negative for cough, sputum production (in am), shortness of breath and wheezing.   Cardiovascular: Positive for leg swelling. Negative for chest pain, palpitations, orthopnea, claudication and PND.       BLE trace  Gastrointestinal: Positive for constipation. Negative for  heartburn, nausea, vomiting, abdominal pain, diarrhea and blood in stool.  Genitourinary: Positive for frequency. Negative for dysuria, urgency, hematuria and flank pain.  Musculoskeletal: Positive for joint pain. Negative for myalgias, back pain, falls and neck pain.  Skin: Positive for itching. Negative for rash.       chronic  Neurological: Negative for dizziness, tingling, tremors, sensory change, speech change, focal weakness, seizures, loss of consciousness, weakness and headaches.  Endo/Heme/Allergies: Negative for environmental allergies and polydipsia. Does not bruise/bleed easily.  Psychiatric/Behavioral: Positive for depression and memory loss. Negative for hallucinations. The patient is not nervous/anxious and does not have insomnia.      Past Medical History  Diagnosis Date  . GERD (gastroesophageal reflux disease)   . Allergy    Medications: Patient's Medications  New Prescriptions   No medications on file  Previous Medications   CALCIUM CARB-CHOLECALCIFEROL (CALCIUM 600 + D) 600-200 MG-UNIT TABS    Take by mouth daily.   DONEPEZIL (ARICEPT) 10 MG TABLET    Take 10 mg by mouth daily.   LORATADINE (CLARITIN) 10 MG TABLET    Take 10 mg by mouth daily.   PSYLLIUM (REGULOID) 0.52 G CAPSULE    Take 0.52 g by mouth daily.   PYRIDOXINE (VITAMIN B-6) 100 MG TABLET    Take 100 mg by mouth daily.   VITAMIN B-12 (CYANOCOBALAMIN) 1000 MCG TABLET    Take 1,000 mcg by mouth daily.  Modified Medications   No medications on file  Discontinued Medications   No medications on file     Physical Exam: Physical Exam  Constitutional: He appears well-developed and well-nourished. No distress.  HENT:  Head: Normocephalic and atraumatic.  Nose: Nose normal.  Mouth/Throat: Oropharynx is clear and moist. No oropharyngeal exudate.  Eyes: Conjunctivae and EOM are normal. Pupils are equal, round, and reactive to light. Right eye exhibits no discharge. Left eye exhibits no discharge. No  scleral icterus.  Neck: Normal range of motion. Neck supple. No JVD present. No thyromegaly present.  Cardiovascular: Normal rate, regular rhythm, normal heart sounds and intact distal pulses.   No murmur heard. Pulmonary/Chest: Effort normal. No respiratory distress. He has no wheezes. He has rales.  Mid to lower lung posteriorly.   Abdominal: Soft. Bowel sounds are normal. He exhibits no distension. There is no tenderness. There is no rebound.  Musculoskeletal: Normal range of motion. He exhibits edema. He exhibits no tenderness.  BLE trace  Lymphadenopathy:    He has no cervical adenopathy.  Neurological: He is alert. He has normal reflexes. No cranial nerve deficit. He exhibits normal muscle tone. Coordination normal.  Skin: Skin is warm and dry. Rash (chest, upper back, arms, neck and head/face-dry, scaly, with mild diffuse eczema ) noted. He is not diaphoretic. No erythema. No pallor.     Psychiatric: His mood appears not anxious. His affect is blunt, labile and inappropriate. His affect is not angry. His speech is not rapid and/or pressured, not delayed, not tangential and not slurred. He is slowed. He is not agitated, not aggressive, not hyperactive, not withdrawn, not actively hallucinating and not combative. Thought content is not paranoid and not delusional. Cognition and memory are impaired. He expresses impulsivity and inappropriate judgment. He exhibits a depressed mood. He is communicative. He exhibits abnormal recent memory and abnormal remote memory. He is attentive.    Filed Vitals:   04/26/14 1346  BP: 110/62  Pulse: 82  Temp: 98.4 F (36.9 C)  TempSrc: Tympanic  Resp: 18      Labs reviewed: Basic Metabolic Panel:  Recent Labs  40/98/1102/28/15 03/23/14 04/04/14  NA 142 140 143  K 4.1 4.6 4.4  BUN 16 12 17   CREATININE 1.0 1.2 1.2   Liver Function Tests: No results for input(s): AST, ALT, ALKPHOS, BILITOT, PROT, ALBUMIN in the last 8760 hours. CBC:  Recent Labs   07/16/13  WBC 7.0  HGB 13.3*  HCT 39*  PLT 274   Lipid Panel:  Recent Labs  11/03/13  CHOL 180  HDL 42  LDLCALC 124  TRIG 68   Assessment/Plan Alzheimer's disease takes Aricept, functioning well in Memory Care Unit, well adjusted. MMSE 9/30 03/09/12      Constipation stable-takes Metamucil and prn Senokot S prn    Diabetes mellitus type 2, noninsulin dependent 03/29/14 CBGs 187, 149, 169, 156, 169-will start Metformin 500mg  po daily with breakfast, continue CBG daily 04/26/14 no hypoglycemic episodes so far.   Diabetic renal disease 04/04/14 Bun/creatinine 17/1.2   Edema extremities Chronic, improved edema BLE from 1+ to trace. No cough, sputum production or paroxysmal nocturnal orthopnea. Continue  Furosemide 20mg  Kcl 20meq po daily  Fatigue Multiple factorials: recent Furosemide and generalized weakness are contributory. Last episode 04/19/14. Observe.     Family/ Staff Communication: observe the patient  Goals of Care: SNF  Labs/tests ordered: none

## 2014-05-24 ENCOUNTER — Non-Acute Institutional Stay (SKILLED_NURSING_FACILITY): Payer: Medicare Other | Admitting: Nurse Practitioner

## 2014-05-24 ENCOUNTER — Encounter: Payer: Self-pay | Admitting: Nurse Practitioner

## 2014-05-24 DIAGNOSIS — R600 Localized edema: Secondary | ICD-10-CM

## 2014-05-24 DIAGNOSIS — E119 Type 2 diabetes mellitus without complications: Secondary | ICD-10-CM

## 2014-05-24 DIAGNOSIS — G309 Alzheimer's disease, unspecified: Secondary | ICD-10-CM

## 2014-05-24 DIAGNOSIS — K5900 Constipation, unspecified: Secondary | ICD-10-CM

## 2014-05-24 DIAGNOSIS — R609 Edema, unspecified: Secondary | ICD-10-CM

## 2014-05-24 DIAGNOSIS — F0280 Dementia in other diseases classified elsewhere without behavioral disturbance: Secondary | ICD-10-CM

## 2014-05-24 DIAGNOSIS — E1121 Type 2 diabetes mellitus with diabetic nephropathy: Secondary | ICD-10-CM

## 2014-05-24 NOTE — Assessment & Plan Note (Signed)
takes Aricept, functioning well in Memory Care Unit, well adjusted. MMSE 9/30 03/09/12. Update TSH and CBC.

## 2014-05-24 NOTE — Assessment & Plan Note (Signed)
stable-takes Metamucil and prn Senokot S prn   

## 2014-05-24 NOTE — Assessment & Plan Note (Signed)
Chronic, improved edema BLE from 1+ to trace. No cough, sputum production or paroxysmal nocturnal orthopnea. Continue  Furosemide 20mg  Kcl 20meq po daily. Update CMP and BNP

## 2014-05-24 NOTE — Assessment & Plan Note (Signed)
04/04/14 Bun/creatinine 17/1.2 05/24/14 update renal function.

## 2014-05-24 NOTE — Progress Notes (Signed)
Patient ID: Aaron Farmer, male   DOB: 04-08-1931, 81 y.o.   MRN: [id number]   Code Status: DNR  Allergies  Allergen Reactions  . Namenda [Memantine Hcl]   . Penicillins     Chief Complaint  Patient presents with  . Medical Management of Chronic Issues    HPI: Patient is a 81 y.o. male seen in the SNF at [facility] today for evaluation of chronic medical conditions.  Problem List Items Addressed This Visit    Edema extremities - Primary    Chronic, improved edema BLE from 1+ to trace. No cough, sputum production or paroxysmal nocturnal orthopnea. Continue  Furosemide  Kcl po daily. Update CMP and BNP    Diabetic renal disease    04/04/14 Bun/creatinine 17/1.2 05/24/14 update renal function.       Diabetes mellitus type 2, noninsulin dependent    03/29/14 CBGs 187, 149, 169, 156, 169-will start Metformin  po daily with breakfast, continue CBG daily 04/26/14 no hypoglycemic episodes so far.  05/24/14 update Hgb A1c     Constipation    stable-takes Metamucil and prn Senokot S prn      Alzheimer's disease    takes Aricept, functioning well in Memory Care Unit, well adjusted. MMSE 9/30 03/09/12. Update TSH and CBC.         Review of Systems:  Review of Systems  Constitutional: Negative for fever, chills, weight loss, malaise/fatigue and diaphoresis.       Frail, unsteady  HENT: Positive for hearing loss. Negative for congestion, ear discharge, nosebleeds and sore throat.   Eyes: Negative for blurred vision, double vision, photophobia, pain, discharge and redness.  Respiratory: Negative for cough, sputum production (in am), shortness of breath and wheezing.   Cardiovascular: Positive for leg swelling. Negative for chest pain, palpitations, orthopnea, claudication and PND.       BLE trace  Gastrointestinal: Positive for constipation. Negative for heartburn, nausea, vomiting, abdominal pain, diarrhea and blood in stool.  Genitourinary: Positive  for frequency. Negative for dysuria, urgency, hematuria and flank pain.  Musculoskeletal: Positive for joint pain. Negative for myalgias, back pain, falls and neck pain.  Skin: Positive for itching. Negative for rash.       chronic  Neurological: Negative for dizziness, tingling, tremors, sensory change, speech change, focal weakness, seizures, loss of consciousness, weakness and headaches.  Endo/Heme/Allergies: Negative for environmental allergies and polydipsia. Does not bruise/bleed easily.  Psychiatric/Behavioral: Positive for depression and memory loss. Negative for hallucinations. The patient is not nervous/anxious and does not have insomnia.      Past Medical History  Diagnosis Date  . GERD (gastroesophageal reflux disease)   . Allergy    Medications: Patient's Medications  New Prescriptions   No medications on file  Previous Medications   CALCIUM CARB-CHOLECALCIFEROL (CALCIUM 600 + D) 600-200 MG-UNIT TABS    Take by mouth daily.   DONEPEZIL (ARICEPT) 10 MG TABLET    Take 10 mg by mouth daily.   LORATADINE (CLARITIN) 10 MG TABLET    Take 10 mg by mouth daily.   PSYLLIUM (REGULOID) 0.52 G CAPSULE    Take 0.52 g by mouth daily.   PYRIDOXINE (VITAMIN B-6) 100 MG TABLET    Take 100 mg by mouth daily.   VITAMIN B-12 (CYANOCOBALAMIN) 1000 MCG TABLET    Take 1,000 mcg by mouth daily.  Modified Medications   No medications on file  Discontinued Medications   No medications on file     Physical Exam:  Physical Exam  Constitutional: He appears well-developed and well-nourished. No distress.  HENT:  Head: Normocephalic and atraumatic.  Nose: Nose normal.  Mouth/Throat: Oropharynx is clear and moist. No oropharyngeal exudate.  Eyes: Conjunctivae and EOM are normal. Pupils are equal, round, and reactive to light. Right eye exhibits no discharge. Left eye exhibits no discharge. No scleral icterus.  Neck: Normal range of motion. Neck supple. No JVD present. No thyromegaly present.    Cardiovascular: Normal rate, regular rhythm, normal heart sounds and intact distal pulses.   No murmur heard. Pulmonary/Chest: Effort normal. No respiratory distress. He has no wheezes. He has rales.  bibasilar lung posteriorly.   Abdominal: Soft. Bowel sounds are normal. He exhibits no distension. There is no tenderness. There is no rebound.  Musculoskeletal: Normal range of motion. He exhibits edema. He exhibits no tenderness.  BLE trace  Lymphadenopathy:    He has no cervical adenopathy.  Neurological: He is alert. He has normal reflexes. No cranial nerve deficit. He exhibits normal muscle tone. Coordination normal.  Skin: Skin is warm and dry. Rash (chest, upper back, arms, neck and head/face-dry, scaly, with mild diffuse eczema ) noted. He is not diaphoretic. No erythema. No pallor.     Psychiatric: His mood appears not anxious. His affect is blunt, labile and inappropriate. His affect is not angry. His speech is not rapid and/or pressured, not delayed, not tangential and not slurred. He is slowed. He is not agitated, not aggressive, not hyperactive, not withdrawn, not actively hallucinating and not combative. Thought content is not paranoid and not delusional. Cognition and memory are impaired. He expresses impulsivity and inappropriate judgment. He exhibits a depressed mood. He is communicative. He exhibits abnormal recent memory and abnormal remote memory. He is attentive.    Filed Vitals:   05/24/14 1555  BP: 128/78  Pulse: 78  Temp: 97.6 F (36.4 C)  TempSrc: Tympanic  Resp: 18      Labs reviewed: Basic Metabolic Panel:  Recent Labs  57/84/6902/28/15 03/23/14 04/04/14  NA 142 140 143  K 4.1 4.6 4.4  BUN 16 12 17   CREATININE 1.0 1.2 1.2   Liver Function Tests: No results for input(s): AST, ALT, ALKPHOS, BILITOT, PROT, ALBUMIN in the last 8760 hours. CBC:  Recent Labs  07/16/13  WBC 7.0  HGB 13.3*  HCT 39*  PLT 274   Lipid Panel:  Recent Labs  11/03/13  CHOL 180   HDL 42  LDLCALC 124  TRIG 68   Assessment/Plan Edema extremities Chronic, improved edema BLE from 1+ to trace. No cough, sputum production or paroxysmal nocturnal orthopnea. Continue  Furosemide 20mg  Kcl 20meq po daily. Update CMP and BNP  Alzheimer's disease takes Aricept, functioning well in Memory Care Unit, well adjusted. MMSE 9/30 03/09/12. Update TSH and CBC.    Diabetes mellitus type 2, noninsulin dependent 03/29/14 CBGs 187, 149, 169, 156, 169-will start Metformin 500mg  po daily with breakfast, continue CBG daily 04/26/14 no hypoglycemic episodes so far.  05/24/14 update Hgb A1c   Constipation stable-takes Metamucil and prn Senokot S prn    Diabetic renal disease 04/04/14 Bun/creatinine 17/1.2 05/24/14 update renal function.       Family/ Staff Communication: observe the patient  Goals of Care: SNF  Labs/tests ordered: CBC, CMP, BNP, TSH, and Hgb A1c

## 2014-05-24 NOTE — Assessment & Plan Note (Signed)
03/29/14 CBGs 187, 149, 169, 156, 169-will start Metformin 500mg  po daily with breakfast, continue CBG daily 04/26/14 no hypoglycemic episodes so far.  05/24/14 update Hgb A1c

## 2014-05-25 LAB — HEPATIC FUNCTION PANEL
ALT: 17 U/L (ref 10–40)
AST: 17 U/L (ref 14–40)
Alkaline Phosphatase: 74 U/L (ref 25–125)
BILIRUBIN, TOTAL: 0.8 mg/dL

## 2014-05-25 LAB — CBC AND DIFFERENTIAL
HEMATOCRIT: 37 % — AB (ref 41–53)
Hemoglobin: 12.7 g/dL — AB (ref 13.5–17.5)
PLATELETS: 280 10*3/uL (ref 150–399)
WBC: 7.7 10^3/mL

## 2014-05-25 LAB — BASIC METABOLIC PANEL
BUN: 10 mg/dL (ref 4–21)
Creatinine: 0.9 mg/dL (ref 0.6–1.3)
Glucose: 140 mg/dL
POTASSIUM: 4.3 mmol/L (ref 3.4–5.3)
SODIUM: 138 mmol/L (ref 137–147)

## 2014-05-25 LAB — TSH: TSH: 4.03 u[IU]/mL (ref 0.41–5.90)

## 2014-05-25 LAB — HEMOGLOBIN A1C: Hgb A1c MFr Bld: 7.8 % — AB (ref 4.0–6.0)

## 2014-05-29 ENCOUNTER — Other Ambulatory Visit: Payer: Self-pay | Admitting: Nurse Practitioner

## 2014-05-29 DIAGNOSIS — R600 Localized edema: Secondary | ICD-10-CM

## 2014-05-29 DIAGNOSIS — E119 Type 2 diabetes mellitus without complications: Secondary | ICD-10-CM

## 2014-06-19 ENCOUNTER — Non-Acute Institutional Stay (SKILLED_NURSING_FACILITY): Payer: Medicare Other | Admitting: Nurse Practitioner

## 2014-06-19 ENCOUNTER — Encounter: Payer: Self-pay | Admitting: Nurse Practitioner

## 2014-06-19 DIAGNOSIS — K5900 Constipation, unspecified: Secondary | ICD-10-CM

## 2014-06-19 DIAGNOSIS — E1121 Type 2 diabetes mellitus with diabetic nephropathy: Secondary | ICD-10-CM

## 2014-06-19 DIAGNOSIS — G309 Alzheimer's disease, unspecified: Secondary | ICD-10-CM

## 2014-06-19 DIAGNOSIS — R609 Edema, unspecified: Secondary | ICD-10-CM

## 2014-06-19 DIAGNOSIS — E119 Type 2 diabetes mellitus without complications: Secondary | ICD-10-CM

## 2014-06-19 DIAGNOSIS — R600 Localized edema: Secondary | ICD-10-CM

## 2014-06-19 DIAGNOSIS — F0280 Dementia in other diseases classified elsewhere without behavioral disturbance: Secondary | ICD-10-CM

## 2014-06-19 NOTE — Progress Notes (Signed)
Patient ID: Aaron Farmer, male   DOB: 04-06-1931, 81 y.o.   MRN: [id number]   Code Status: DNR  Allergies  Allergen Reactions  . Namenda [Memantine Hcl]   . Penicillins     Chief Complaint  Patient presents with  . Medical Management of Chronic Issues  . Acute Visit    edema    HPI: Patient is a 81 y.o. male seen in the SNF at [facility] today for evaluation of edema and chronic medical conditions.  Problem List Items Addressed This Visit    Edema extremities - Primary    Chronic, improved edema BLE from 1+ to trace. No cough, sputum production or paroxysmal nocturnal orthopnea. Continue  Furosemide  Kcl po daily.  06/19/14 worsened edema w/o pulmonary symptoms. Will increase Furosemide to  daily. Update BMP.        Diabetic renal disease    04/04/14 Bun/creatinine 17/1.2 06/19/14 update BMP       Diabetes mellitus type 2, noninsulin dependent    03/29/14 CBGs 187, 149, 169, 156, 169-will start Metformin  po daily with breakfast, continue CBG daily 04/26/14 no hypoglycemic episodes so far.  05/25/14 Hgb A1c 7.6        Constipation    stable-takes Metamucil and prn Senokot S prn         Alzheimer's disease    off Aricept, functioning well in Memory Care Unit, well adjusted. MMSE 9/30 03/09/12 and 7/30 06/01/12         Review of Systems:  Review of Systems  Constitutional: Negative for fever, chills, weight loss, malaise/fatigue and diaphoresis.  HENT: Positive for hearing loss. Negative for congestion, ear discharge, ear pain, nosebleeds, sore throat and tinnitus.   Eyes: Negative for blurred vision, double vision, photophobia, pain, discharge and redness.  Respiratory: Negative for cough, hemoptysis, sputum production, shortness of breath, wheezing and stridor.   Cardiovascular: Positive for leg swelling. Negative for chest pain, palpitations, orthopnea, claudication and PND.       1+ BLE  Gastrointestinal: Negative for  heartburn, nausea, vomiting, abdominal pain, diarrhea, constipation, blood in stool and melena.  Genitourinary: Positive for frequency. Negative for dysuria, urgency, hematuria and flank pain.  Musculoskeletal: Negative for myalgias, back pain, joint pain, falls and neck pain.  Skin: Negative for itching and rash.  Neurological: Negative for dizziness, tingling, tremors, sensory change, speech change, focal weakness, seizures, loss of consciousness, weakness and headaches.  Endo/Heme/Allergies: Negative for environmental allergies. Does not bruise/bleed easily.  Psychiatric/Behavioral: Positive for memory loss. Negative for depression, suicidal ideas, hallucinations and substance abuse. The patient is not nervous/anxious and does not have insomnia.      Past Medical History  Diagnosis Date  . GERD (gastroesophageal reflux disease)   . Allergy    Medications: Patient's Medications  New Prescriptions   No medications on file  Previous Medications   CALCIUM CARB-CHOLECALCIFEROL (CALCIUM 600 + D) 600-200 MG-UNIT TABS    Take by mouth daily.   DONEPEZIL (ARICEPT) 10 MG TABLET    Take 10 mg by mouth daily.   LORATADINE (CLARITIN) 10 MG TABLET    Take 10 mg by mouth daily.   PSYLLIUM (REGULOID) 0.52 G CAPSULE    Take 0.52 g by mouth daily.   PYRIDOXINE (VITAMIN B-6) 100 MG TABLET    Take 100 mg by mouth daily.   VITAMIN B-12 (CYANOCOBALAMIN) 1000 MCG TABLET    Take 1,000 mcg by mouth daily.  Modified Medications   No medications on file  Discontinued Medications   No medications on file     Physical Exam: Physical Exam  Constitutional: He appears well-developed and well-nourished. No distress.  HENT:  Head: Normocephalic and atraumatic.  Nose: Nose normal.  Mouth/Throat: Oropharynx is clear and moist. No oropharyngeal exudate.  Eyes: Conjunctivae and EOM are normal. Pupils are equal, round, and reactive to light. Right eye exhibits no discharge. Left eye exhibits no discharge. No  scleral icterus.  Neck: Normal range of motion. Neck supple. No JVD present. No thyromegaly present.  Cardiovascular: Normal rate, regular rhythm, normal heart sounds and intact distal pulses.   No murmur heard. Pulmonary/Chest: Effort normal. No respiratory distress. He has no wheezes. He has rales.  bibasilar lung posteriorly.   Abdominal: Soft. Bowel sounds are normal. He exhibits no distension. There is no tenderness. There is no rebound.  Musculoskeletal: Normal range of motion. He exhibits edema. He exhibits no tenderness.  BLE 1+  Lymphadenopathy:    He has no cervical adenopathy.  Neurological: He is alert. He has normal reflexes. No cranial nerve deficit. He exhibits normal muscle tone. Coordination normal.  Skin: Skin is warm and dry. No rash (chest, upper back, arms, neck and head/face-dry, scaly, with mild diffuse eczema ) noted. He is not diaphoretic. No erythema. No pallor.     Psychiatric: His mood appears not anxious. His affect is blunt, labile and inappropriate. His affect is not angry. His speech is not rapid and/or pressured, not delayed, not tangential and not slurred. He is slowed. He is not agitated, not aggressive, not hyperactive, not withdrawn, not actively hallucinating and not combative. Thought content is not paranoid and not delusional. Cognition and memory are impaired. He expresses impulsivity and inappropriate judgment. He exhibits a depressed mood. He is communicative. He exhibits abnormal recent memory and abnormal remote memory. He is attentive.    Filed Vitals:   06/19/14 1544  BP: 140/70  Pulse: 76  Temp: 96.9 F (36.1 C)  TempSrc: Tympanic  Resp: 28      Labs reviewed: Basic Metabolic Panel:  Recent Labs  40/98/1109/09/30 04/04/14 05/25/14  NA 140 143 138  K 4.6 4.4 4.3  BUN 12 17 10   CREATININE 1.2 1.2 0.9  TSH  --   --  4.03   Liver Function Tests:  Recent Labs  05/25/14  AST 17  ALT 17  ALKPHOS 74   CBC:  Recent Labs  07/16/13  05/25/14  WBC 7.0 7.7  HGB 13.3* 12.7*  HCT 39* 37*  PLT 274 280   Lipid Panel:  Recent Labs  11/03/13  CHOL 180  HDL 42  LDLCALC 124  TRIG 68   Assessment/Plan Edema extremities Chronic, improved edema BLE from 1+ to trace. No cough, sputum production or paroxysmal nocturnal orthopnea. Continue  Furosemide 20mg  Kcl 20meq po daily.  06/19/14 worsened edema w/o pulmonary symptoms. Will increase Furosemide to 40mg  daily. Update BMP.     Diabetes mellitus type 2, noninsulin dependent 03/29/14 CBGs 187, 149, 169, 156, 169-will start Metformin 500mg  po daily with breakfast, continue CBG daily 04/26/14 no hypoglycemic episodes so far.  05/25/14 Hgb A1c 7.6     Diabetic renal disease 04/04/14 Bun/creatinine 17/1.2 06/19/14 update BMP    Constipation stable-takes Metamucil and prn Senokot S prn      Alzheimer's disease off Aricept, functioning well in Memory Care Unit, well adjusted. MMSE 9/30 03/09/12 and 7/30 06/01/12     Family/ Staff Communication: observe the patient  Goals of Care: SNF  Labs/tests  ordered: BMP

## 2014-06-19 NOTE — Assessment & Plan Note (Signed)
stable-takes Metamucil and prn Senokot S prn   

## 2014-06-19 NOTE — Assessment & Plan Note (Signed)
04/04/14 Bun/creatinine 17/1.2 06/19/14 update BMP

## 2014-06-19 NOTE — Assessment & Plan Note (Signed)
off Aricept, functioning well in Memory Care Unit, well adjusted. MMSE 9/30 03/09/12 and 7/30 06/01/12

## 2014-06-19 NOTE — Assessment & Plan Note (Signed)
03/29/14 CBGs 187, 149, 169, 156, 169-will start Metformin 500mg  po daily with breakfast, continue CBG daily 04/26/14 no hypoglycemic episodes so far.  05/25/14 Hgb A1c 7.6

## 2014-06-19 NOTE — Assessment & Plan Note (Signed)
Chronic, improved edema BLE from 1+ to trace. No cough, sputum production or paroxysmal nocturnal orthopnea. Continue  Furosemide 20mg  Kcl 20meq po daily.  06/19/14 worsened edema w/o pulmonary symptoms. Will increase Furosemide to 40mg  daily. Update BMP.

## 2014-06-27 LAB — BASIC METABOLIC PANEL
BUN: 17 mg/dL (ref 4–21)
CREATININE: 1 mg/dL (ref 0.6–1.3)
GLUCOSE: 199 mg/dL
Potassium: 3.9 mmol/L (ref 3.4–5.3)
Sodium: 146 mmol/L (ref 137–147)

## 2014-06-29 ENCOUNTER — Other Ambulatory Visit: Payer: Self-pay | Admitting: Nurse Practitioner

## 2014-07-06 ENCOUNTER — Other Ambulatory Visit: Payer: Self-pay | Admitting: Nurse Practitioner

## 2014-07-06 DIAGNOSIS — E0821 Diabetes mellitus due to underlying condition with diabetic nephropathy: Secondary | ICD-10-CM

## 2014-07-06 DIAGNOSIS — R600 Localized edema: Secondary | ICD-10-CM

## 2014-07-06 LAB — BASIC METABOLIC PANEL
BUN: 20 mg/dL (ref 4–21)
CREATININE: 1.2 mg/dL (ref 0.6–1.3)
GLUCOSE: 192 mg/dL
POTASSIUM: 4.2 mmol/L (ref 3.4–5.3)
SODIUM: 148 mmol/L — AB (ref 137–147)

## 2014-07-08 ENCOUNTER — Emergency Department (HOSPITAL_COMMUNITY)
Admission: EM | Admit: 2014-07-08 | Discharge: 2014-07-08 | Disposition: A | Discharge status: Home / Self Care | Payer: Medicare Other | Attending: Emergency Medicine | Admitting: Emergency Medicine

## 2014-07-08 ENCOUNTER — Encounter (HOSPITAL_COMMUNITY): Payer: Self-pay | Admitting: *Deleted

## 2014-07-08 ENCOUNTER — Emergency Department (HOSPITAL_COMMUNITY): Payer: Medicare Other

## 2014-07-08 DIAGNOSIS — Z880 Allergy status to penicillin: Secondary | ICD-10-CM | POA: Diagnosis not present

## 2014-07-08 DIAGNOSIS — W01198A Fall on same level from slipping, tripping and stumbling with subsequent striking against other object, initial encounter: Secondary | ICD-10-CM | POA: Diagnosis not present

## 2014-07-08 DIAGNOSIS — E119 Type 2 diabetes mellitus without complications: Secondary | ICD-10-CM | POA: Diagnosis not present

## 2014-07-08 DIAGNOSIS — Z79899 Other long term (current) drug therapy: Secondary | ICD-10-CM | POA: Insufficient documentation

## 2014-07-08 DIAGNOSIS — E785 Hyperlipidemia, unspecified: Secondary | ICD-10-CM | POA: Diagnosis not present

## 2014-07-08 DIAGNOSIS — Y9389 Activity, other specified: Secondary | ICD-10-CM | POA: Diagnosis not present

## 2014-07-08 DIAGNOSIS — Y92128 Other place in nursing home as the place of occurrence of the external cause: Secondary | ICD-10-CM | POA: Insufficient documentation

## 2014-07-08 DIAGNOSIS — Z8719 Personal history of other diseases of the digestive system: Secondary | ICD-10-CM | POA: Diagnosis not present

## 2014-07-08 DIAGNOSIS — W19XXXA Unspecified fall, initial encounter: Secondary | ICD-10-CM

## 2014-07-08 DIAGNOSIS — F0391 Unspecified dementia with behavioral disturbance: Secondary | ICD-10-CM | POA: Diagnosis not present

## 2014-07-08 DIAGNOSIS — I499 Cardiac arrhythmia, unspecified: Secondary | ICD-10-CM | POA: Diagnosis not present

## 2014-07-08 DIAGNOSIS — Y998 Other external cause status: Secondary | ICD-10-CM | POA: Diagnosis not present

## 2014-07-08 DIAGNOSIS — S0101XA Laceration without foreign body of scalp, initial encounter: Secondary | ICD-10-CM | POA: Diagnosis present

## 2014-07-08 HISTORY — DX: Type 2 diabetes mellitus without complications: E11.9

## 2014-07-08 HISTORY — DX: Hyperlipidemia, unspecified: E78.5

## 2014-07-08 HISTORY — DX: Unspecified dementia, unspecified severity, without behavioral disturbance, psychotic disturbance, mood disturbance, and anxiety: F03.90

## 2014-07-08 LAB — CBG MONITORING, ED
GLUCOSE-CAPILLARY: 192 mg/dL — AB (ref 70–99)
Glucose-Capillary: 178 mg/dL — ABNORMAL HIGH (ref 70–99)

## 2014-07-08 NOTE — ED Provider Notes (Addendum)
Medical screening examination/treatment/procedure(s) were conducted as a shared visit with non-physician practitioner(s) and myself.  I personally evaluated the patient during the encounter.   EKG Interpretation   Date/Time:  Saturday July 08 2014 10:34:16 EST Ventricular Rate:  103 PR Interval:    QRS Duration: 147 QT Interval:  424 QTC Calculation: 555 R Axis:   -68 Text Interpretation:  Atrial fibrillation RBBB and LAFB Baseline wander in  lead(s) V3 new from prior Confirmed by Latashia Koch  MD, Aryahi Denzler ([id number]) on  07/08/2014 11:05:18 AM     Pt here after unwitnessed fall at NH--laceration noted at scalp--will obtain head/neck ct  Toy BakerAnthony T Javiana Anwar, MD 07/08/14 [id number]  Toy BakerAnthony T Nerea Bordenave, MD 07/08/14 1106

## 2014-07-08 NOTE — ED Notes (Signed)
Bed: [id number] Expected date: 07/08/14 Expected time: 8:09 AM Means of arrival: Ambulance Comments: Fall/Head Lac

## 2014-07-08 NOTE — ED Notes (Signed)
[facility] being found on floor after apparent fall.  Patient has a 2cm laceration to left parietal area.  Bleeding is controlled.  +2 radial and pedal pulses.  Breath sounds clear, but diminished because patient would not take deep breaths on exam.  Bowel sounds hypoactive, abdomen soft and non-tender to palpation.  Heart sounds WNL - irregular.  S1/S2, no S3/S4.

## 2014-07-08 NOTE — ED Provider Notes (Signed)
CSN: [id number]     Arrival date & time 07/08/14  0809 History   First MD Initiated Contact with Patient 07/08/14 [phone]     Chief Complaint  Patient presents with  . Head Laceration     (Consider location/radiation/quality/duration/timing/severity/associated sxs/prior Treatment) The history is provided by the EMS personnel and a caregiver.  Aaron Farmer is a 81 y/o M with PMHx of dementia, DM, GERD, HLD presenting to the ED with an unwitnessed fall this morning. EMS reported that patient was found in the room on the floor with bleeding from the scalp. Patient was brought to the ED to be assessed, patient is a resident at [facility] memory unit at [facility]. No blood thinners noted on medication list. ROS limited secondary to dementia of patient.  Level V Caveat   Past Medical History  Diagnosis Date  . GERD (gastroesophageal reflux disease)   . Allergy   . Dementia   . Diabetes mellitus without complication   . Hyperlipemia    Past Surgical History  Procedure Laterality Date  . Hernia repair     No family history on file. History  Substance Use Topics  . Smoking status: Unknown If Ever Smoked  . Smokeless tobacco: Not on file  . Alcohol Use: No    Review of Systems  Unable to perform ROS: Dementia      Allergies  Namenda and Penicillins  Home Medications   Prior to Admission medications   Medication Sig Start Date End Date Taking? Authorizing Provider  Calcium Carb-Cholecalciferol (CALCIUM 600 + D) 600-200 MG-UNIT TABS Take 1 tablet by mouth every morning.    Yes Historical Provider, MD  furosemide (LASIX) 20 MG tablet Take 10 mg by mouth every morning.   Yes Historical Provider, MD  loratadine (CLARITIN) 10 MG tablet Take 10 mg by mouth every morning.    Yes Historical Provider, MD  metFORMIN (GLUCOPHAGE) 500 MG tablet Take 500 mg by mouth daily with breakfast.   Yes Historical Provider, MD  potassium chloride (K-DUR) 10 MEQ tablet Take 10 mEq by mouth every  morning.   Yes Historical Provider, MD  psyllium (METAMUCIL) 58.6 % packet Take 1 packet by mouth 2 (two) times daily.   Yes Historical Provider, MD  pyridOXINE (VITAMIN B-6) 100 MG tablet Take 100 mg by mouth every morning.    Yes Historical Provider, MD  vitamin B-12 (CYANOCOBALAMIN) 1000 MCG tablet Take 1,000 mcg by mouth every morning.    Yes Historical Provider, MD   BP 138/115 mmHg  Pulse 101  Temp(Src) 98.4 F (36.9 C) (Oral)  Resp 17  SpO2 97% Physical Exam  Constitutional: He appears well-developed and well-nourished. No distress.  HENT:  Head: Normocephalic and atraumatic.  Left parietal scalp laceration measuring approximately 2 cm in length with bleeding controlled.  Eyes: Conjunctivae and EOM are normal. Pupils are equal, round, and reactive to light. Right eye exhibits no discharge. Left eye exhibits no discharge.  Neck:  C-collar placed  Cardiovascular: Normal rate and normal heart sounds.  An irregular rhythm present.  Pulses:      Radial pulses are 2+ on the right side, and 2+ on the left side.       Dorsalis pedis pulses are 2+ on the right side, and 2+ on the left side.  Pulmonary/Chest: Effort normal and breath sounds normal. No respiratory distress. He has no wheezes. He has no rales.  Abdominal: Soft. Bowel sounds are normal. He exhibits no distension. There is no tenderness. There  is no rebound and no guarding.  Musculoskeletal: Normal range of motion.  Negative deformities, ecchymosis noted to the spine. Negative pain upon palpation.   Full ROM to upper and lower extremities without difficulty noted, negative ataxia noted.  Neurological: He is alert.  Good strength  Strong grip strength bilaterally   Skin: Skin is warm and dry. No rash noted. He is not diaphoretic. No erythema.  Psychiatric:  Flat affect  Nursing note and vitals reviewed.   ED Course  Procedures (including critical care time)  LACERATION REPAIR Performed by: Raymon Mutton Authorized by: Raymon Mutton Consent: Verbal consent obtained. Risks and benefits: risks, benefits and alternatives were discussed Consent given by: patient Patient identity confirmed: provided demographic data Prepped and Draped in normal sterile fashion Wound explored Laceration Location: Left parietal scalp Laceration Length: 2 cm No Foreign Bodies seen or palpated Irrigation method: syringe Amount of cleaning: Extensive  Skin closure: Approximate  Number of staples: 4 staples  Patient tolerance: Patient tolerated the procedure well with no immediate complications.  Labs Review Labs Reviewed  CBG MONITORING, ED - Abnormal; Notable for the following:    Glucose-Capillary 192 (*)    All other components within normal limits  CBG MONITORING, ED    Imaging Review Ct Head Wo Contrast  07/08/2014   CLINICAL DATA:  Fall at nursing home with head injury. Found down on floor.  EXAM: CT HEAD WITHOUT CONTRAST  CT CERVICAL SPINE WITHOUT CONTRAST  TECHNIQUE: Multidetector CT imaging of the head and cervical spine was performed following the standard protocol without intravenous contrast. Multiplanar CT image reconstructions of the cervical spine were also generated.  COMPARISON:  CT head on 08/03/2005  FINDINGS: CT HEAD FINDINGS  Marked progression of brain atrophy since the prior study is well as small vessel ischemic changes. There also is an interval old-appearing infarct of the posterior left parietal cortex. The brain demonstrates no evidence of hemorrhage, acute infarction, edema, mass effect, extra-axial fluid collection, hydrocephalus or mass lesion. No skull fracture identified.  CT CERVICAL SPINE FINDINGS  The cervical spine shows normal alignment. There is no evidence of acute fracture or subluxation. No soft tissue swelling or hematoma is identified. Degenerate spondylosis of the cervical spine present at multiple levels, most prominently at C3-4, C4-5 and C6-7. There is a  minimal anterolisthesis of C5 on C6 of approximately 2 mm. No bony lesions are seen.  Asymmetry of the airway is noted at roughly the level of the false and true vocal cords with soft tissue thickening on the right compared to the left. Some of this may represent some mucoid material in the airway. However, underlying soft tissue lesion is not excluded. Some of the findings could also relate to relative paralysis of the right vocal cord compared to the left. Correlation is suggested clinically.  IMPRESSION: 1. 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 No acute findings currently. 2. Cervical spondylosis without evidence of cervical fracture. Minimal degenerative anterolisthesis of C5 on C6. 3. Asymmetry of the airway at the level of the vocal cords with soft tissue thickening on the right. This could be a combination of mucoid material, soft tissue lesion and/or relative right-sided vocal cord paralysis.   Electronically Signed   By: Irish Lack M.D.   On: 07/08/2014 09:35   Ct Cervical Spine Wo Contrast  07/08/2014   CLINICAL DATA:  Fall at nursing home with head injury. Found down on floor.  EXAM:  CT HEAD WITHOUT CONTRAST  CT CERVICAL SPINE WITHOUT CONTRAST  TECHNIQUE: Multidetector CT imaging of the head and cervical spine was performed following the standard protocol without intravenous contrast. Multiplanar CT image reconstructions of the cervical spine were also generated.  COMPARISON:  CT head on 08/03/2005  FINDINGS: CT HEAD FINDINGS  Marked progression of brain atrophy since the prior study is well as small vessel ischemic changes. There also is an interval old-appearing infarct of the posterior left parietal cortex. The brain demonstrates no evidence of hemorrhage, acute infarction, edema, mass effect, extra-axial fluid collection, hydrocephalus or mass lesion. No skull fracture identified.  CT CERVICAL SPINE FINDINGS  The cervical  spine shows normal alignment. There is no evidence of acute fracture or subluxation. No soft tissue swelling or hematoma is identified. Degenerate spondylosis of the cervical spine present at multiple levels, most prominently at C3-4, C4-5 and C6-7. There is a minimal anterolisthesis of C5 on C6 of approximately 2 mm. No bony lesions are seen.  Asymmetry of the airway is noted at roughly the level of the false and true vocal cords with soft tissue thickening on the right compared to the left. Some of this may represent some mucoid material in the airway. However, underlying soft tissue lesion is not excluded. Some of the findings could also relate to relative paralysis of the right vocal cord compared to the left. Correlation is suggested clinically.  IMPRESSION: 1. 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 No acute findings currently. 2. Cervical spondylosis without evidence of cervical fracture. Minimal degenerative anterolisthesis of C5 on C6. 3. Asymmetry of the airway at the level of the vocal cords with soft tissue thickening on the right. This could be a combination of mucoid material, soft tissue lesion and/or relative right-sided vocal cord paralysis.   Electronically Signed   By: Irish Lack M.D.   On: 07/08/2014 09:35     EKG Interpretation   Date/Time:  Saturday July 08 2014 10:34:16 EST Ventricular Rate:  103 PR Interval:    QRS Duration: 147 QT Interval:  424 QTC Calculation: 555 R Axis:   -68 Text Interpretation:  Atrial fibrillation RBBB and LAFB Baseline wander in  lead(s) V3 new from prior Confirmed by ALLEN  MD, ANTHONY ([id number]) on  07/08/2014 11:05:18 AM       8:14 AM This provider spoke with Patience, RN from facility. Reported that his morning age for checking on patient every hour. Reported that approximately 645-6:50 AM this morning the aches when it to perform personal care on the patient but patient  refused. Reported that the aid went back around 7:00 AM and found patient on the floor. Reported that patient's head was near the door and that there was bleeding-patient had him on board. Patient was laying flat on his back. Fall was unwitnessed. As per staff, reported the patient is at baseline, reported the patient does not normally speak. Reported that his tetanus shot was on 10/09/2011.   MDM   Final diagnoses:  Scalp laceration, initial encounter  Fall, initial encounter  Dementia, with behavioral disturbance    Medications - No data to display  Filed Vitals:   07/08/14 0819 07/08/14 1009  BP: 130/70 138/115  Pulse: 99 101  Temp: 98.4 F (36.9 C)   TempSrc: Oral   Resp: 19 17  SpO2: 93% 97%   EKG noted atrial fibrillation with RBBB and LAFB with heart rate of 103 bpm. Patient presenting  to the ED with an unwitnessed fall that occurred this morning at a nursing facility, [facility] [place]. Approximate 2 cm laceration localized to the left part of parietal of the scalp with bleeding controlled. Wound thoroughly cleaned and 4 staples placed-patient tolerated procedure well. Staples to be removed within 7 days. Patient up-to-date with tetanus. Patient has history of dementia, neurological exam limited secondary to patient being level V caveat. According to nursing facility, patient is currently at baseline. CBG 192. CT head noted progression of brain atrophy-no acute findings or intracranial abnormalities noted. CT cervical spine noted cervical spondylosis without evidence of cervical fracture-minimal degenerative anterolithiesis of C5 on C6. Discussed case and reviewed imagine in great detail with Dr. Cordelia PocheA. Allen, who saw and assessed patient - reported that patient is stable for discharge. As per physician, recommended patient to be seen and assessed by PCP regarding heart rate. Patient at baseline. Patient stable, afebrile. Patient not septic appearing. [facility]  [facility]. Referred to PCP. Instructions for wound care on discharge paperwork.   Raymon MuttonMarissa Tamer Baughman, PA-C 07/08/14 1122

## 2014-07-08 NOTE — ED Notes (Signed)
Per EMS [facility]  Patient got out of bed without assistance and apparently fell, hitting head on hinges of a door.  Staff found patient on floor behind door.  Patient has a 2 cm (approximate) laceration on left posterior head.  Bleeding is controlled.  Vitals 130/80, HR 88, RR 16.  CBG 214.  Staff is oriented to baseline per SNF staff.  Patient is non-verbal.

## 2014-07-08 NOTE — ED Notes (Signed)
Transported to radiology 

## 2014-07-08 NOTE — ED Notes (Signed)
Per SNF staff, patient had a Tetanus shot in 2013.

## 2014-07-08 NOTE — Discharge Instructions (Signed)
Please call your doctor for a followup appointment within 24-48 hours. When you talk to your doctor please let them know that you were seen in the emergency department and have them acquire all of your records so that they can discuss the findings with you and formulate a treatment plan to fully care for your new and ongoing problems. Staples need to be removed within 7 days  Please have patient follow up with his primary care provider to be seen regarding his heart rate  Please keep site clean with warm water and soap  Please continue to monitor symptoms closely and if symptoms are to worsen or change (fever greater than 101, chills, sweating, nausea, vomiting, chest pain, shortness of breathe, difficulty breathing, weakness, numbness, tingling, worsening or changes to pain pattern, fall, injury, changes to behavior or baseline) please report back to the Emergency Department immediately.   Staple Removal, Care After The staples that were used to close your skin have been removed. The care described here will need to continue until the wound is completely healed and your health care provider confirms that wound care can be stopped. HOME CARE INSTRUCTIONS   Keep the wound site dry and clean. Do not soak it in water.  If skin adhesive strips were applied after the staples were removed, they will begin to peel off in a few days. Allow them to remain in place until they fall off on their own.  If you still have a bandage (dressing), change it at least once a day or as directed by your health care provider. If the dressing sticks, pour warm, sterile water over it until it loosens and can be removed without pulling apart the wound edges. Pat dry with a clean towel.  Apply cream or ointment that stops the growth of bacteria (antibacterial cream or ointment) only if your health care provider has directed you to do so. Place a nonstick bandage over the wound to prevent the dressing from sticking.  Cover the  nonstick bandage with a new dressing as directed by your health care provider.  If the bandage becomes wet, dirty, or develops a bad smell, change it as soon as possible.  New scars become sunburned easily. Use sunscreens with a sun protection factor (SPF) of at least 15 when out in the sun. Reapply the SPF every 2 hours.  Only take medicines as directed by your health care provider. SEEK IMMEDIATE MEDICAL CARE IF:   You have redness, swelling, or increasing pain in the wound.  You have pus coming from the wound.  You have a fever.  You notice a bad smell coming from the wound or dressing.  Your wound edges open up after staples have been removed. MAKE SURE YOU:   Understand these instructions.  Will watch your condition.  Will get help right away if you are not doing well or get worse. Document Released: 04/17/2008 Document Revised: 05/10/2013 Document Reviewed: 04/17/2008 [facility] Patient Information 2015 [place], [place]. This information is not intended to replace advice given to you by your health care provider. Make sure you discuss any questions you have with your health care provider.   Fall Prevention and Home Safety Falls cause injuries and can affect all age groups. It is possible to use preventive measures to significantly decrease the likelihood of falls. There are many simple measures which can make your home safer and prevent falls. OUTDOORS  Repair cracks and edges of walkways and driveways.  Remove high doorway thresholds.  Trim  shrubbery on the main path into your home.  Have good outside lighting.  Clear walkways of tools, rocks, debris, and clutter.  Check that handrails are not broken and are securely fastened. Both sides of steps should have handrails.  Have leaves, snow, and ice cleared regularly.  Use sand or salt on walkways during winter months.  In the garage, clean up grease or oil spills. BATHROOM  Install night lights.  Install grab  bars by the toilet and in the tub and shower.  Use non-skid mats or decals in the tub or shower.  Place a plastic non-slip stool in the shower to sit on, if needed.  Keep floors dry and clean up all water on the floor immediately.  Remove soap buildup in the tub or shower on a regular basis.  Secure bath mats with non-slip, double-sided rug tape.  Remove throw rugs and tripping hazards from the floors. BEDROOMS  Install night lights.  Make sure a bedside light is easy to reach.  Do not use oversized bedding.  Keep a telephone by your bedside.  Have a firm chair with side arms to use for getting dressed.  Remove throw rugs and tripping hazards from the floor. KITCHEN  Keep handles on pots and pans turned toward the center of the stove. Use back burners when possible.  Clean up spills quickly and allow time for drying.  Avoid walking on wet floors.  Avoid hot utensils and knives.  Position shelves so they are not too high or low.  Place commonly used objects within easy reach.  If necessary, use a sturdy step stool with a grab bar when reaching.  Keep electrical cables out of the way.  Do not use floor polish or wax that makes floors slippery. If you must use wax, use non-skid floor wax.  Remove throw rugs and tripping hazards from the floor. STAIRWAYS  Never leave objects on stairs.  Place handrails on both sides of stairways and use them. Fix any loose handrails. Make sure handrails on both sides of the stairways are as long as the stairs.  Check carpeting to make sure it is firmly attached along stairs. Make repairs to worn or loose carpet promptly.  Avoid placing throw rugs at the top or bottom of stairways, or properly secure the rug with carpet tape to prevent slippage. Get rid of throw rugs, if possible.  Have an electrician put in a light switch at the top and bottom of the stairs. OTHER FALL PREVENTION TIPS  Wear low-heel or rubber-soled shoes that  are supportive and fit well. Wear closed toe shoes.  When using a stepladder, make sure it is fully opened and both spreaders are firmly locked. Do not climb a closed stepladder.  Add color or contrast paint or tape to grab bars and handrails in your home. Place contrasting color strips on first and last steps.  Learn and use mobility aids as needed. Install an electrical emergency response system.  Turn on lights to avoid dark areas. Replace light bulbs that burn out immediately. Get light switches that glow.  Arrange furniture to create clear pathways. Keep furniture in the same place.  Firmly attach carpet with non-skid or double-sided tape.  Eliminate uneven floor surfaces.  Select a carpet pattern that does not visually hide the edge of steps.  Be aware of all pets. OTHER HOME SAFETY TIPS  Set the water temperature for 120 F (48.8 C).  Keep emergency numbers on or near the telephone.  Keep smoke detectors on every level of the home and near sleeping areas. Document Released: 04/25/2002 Document Revised: 11/04/2011 Document Reviewed: 07/25/2011 [facility] Patient Information 2015 [place], [place]. This information is not intended to replace advice given to you by your health care provider. Make sure you discuss any questions you have with your health care provider.  Dementia Dementia is a general term for problems with brain function. A person with dementia has memory loss and a hard time with at least one other brain function such as thinking, speaking, or problem solving. Dementia can affect social functioning, how you do your job, your mood, or your personality. The changes may be hidden for a long time. The earliest forms of this disease are usually not detected by family or friends. Dementia can be:  Irreversible.  Potentially reversible.  Partially reversible.  Progressive. This means it can get worse over time. CAUSES  Irreversible dementia causes may  include:  Degeneration of brain cells (Alzheimer disease or Lewy body dementia).  Multiple small strokes (vascular dementia).  Infection (chronic meningitis or Creutzfeldt-Jakob disease).  Frontotemporal dementia. This affects younger people, age 53 to 59, compared to those who have Alzheimer disease.  Dementia associated with other disorders like Parkinson disease, Huntington disease, or HIV-associated dementia. Potentially or partially reversible dementia causes may include:  Medicines.  Metabolic causes such as excessive alcohol intake, vitamin B12 deficiency, or thyroid disease.  Masses or pressure in the brain such as a tumor, blood clot, or hydrocephalus. SIGNS AND SYMPTOMS  Symptoms are often hard to detect. Family members or coworkers may not notice them early in the disease process. Different people with dementia may have different symptoms. Symptoms can include:  A hard time with memory, especially recent memory. Long-term memory may not be impaired.  Asking the same question multiple times or forgetting something someone just said.  A hard time speaking your thoughts or finding certain words.  A hard time solving problems or performing familiar tasks (such as how to use a telephone).  Sudden changes in mood.  Changes in personality, especially increasing moodiness or mistrust.  Depression.  A hard time understanding complex ideas that were never a problem in the past. DIAGNOSIS  There are no specific tests for dementia.   Your health care provider may recommend a thorough evaluation. This is because some forms of dementia can be reversible. The evaluation will likely include a physical exam and getting a detailed history from you and a family member. The history often gives the best clues and suggestions for a diagnosis.  Memory testing may be done. A detailed brain function evaluation called neuropsychologic testing may be helpful.  Lab tests and brain imaging  (such as a CT scan or MRI scan) are sometimes important.  Sometimes observation and re-evaluation over time is very helpful. TREATMENT  Treatment depends on the cause.   If the problem is a vitamin deficiency, it may be helped or cured with supplements.  For dementias such as Alzheimer disease, medicines are available to stabilize or slow the course of the disease. There are no cures for this type of dementia.  Your health care provider can help direct you to groups, organizations, and other health care providers to help with decisions in the care of you or your loved one. HOME CARE INSTRUCTIONS The care of individuals with dementia is varied and dependent upon the progression of the dementia. The following suggestions are intended for the person living with, or caring for, the person with  dementia.  Create a safe environment.  Remove the locks on bathroom doors to prevent the person from accidentally locking himself or herself in.  Use childproof latches on kitchen cabinets and any place where cleaning supplies, chemicals, or alcohol are kept.  Use childproof covers in unused electrical outlets.  Install childproof devices to keep doors and windows secured.  Remove stove knobs or install safety knobs and an automatic shut-off on the stove.  Lower the temperature on water heaters.  Label medicines and keep them locked up.  Secure knives, lighters, matches, power tools, and guns, and keep these items out of reach.  Keep the house free from clutter. Remove rugs or anything that might contribute to a fall.  Remove objects that might break and hurt the person.  Make sure lighting is good, both inside and outside.  Install grab rails as needed.  Use a monitoring device to alert you to falls or other needs for help.  Reduce confusion.  Keep familiar objects and people around.  Use night lights or dim lights at night.  Label items or areas.  Use reminders, notes, or  directions for daily activities or tasks.  Keep a simple, consistent routine for waking, meals, bathing, dressing, and bedtime.  Create a calm, quiet environment.  Place large clocks and calendars prominently.  Display emergency numbers and home address near all telephones.  Use cues to establish different times of the day. An example is to open curtains to let the natural light in during the day.   Use effective communication.  Choose simple words and short sentences.  Use a gentle, calm tone of voice.  Be careful not to interrupt.  If the person is struggling to find a word or communicate a thought, try to provide the word or thought.  Ask one question at a time. Allow the person ample time to answer questions. Repeat the question again if the person does not respond.  Reduce nighttime restlessness.  Provide a comfortable bed.  Have a consistent nighttime routine.  Ensure a regular walking or physical activity schedule. Involve the person in daily activities as much as possible.  Limit napping during the day.  Limit caffeine.  Attend social events that stimulate rather than overwhelm the senses.  Encourage good nutrition and hydration.  Reduce distractions during meal times and snacks.  Avoid foods that are too hot or too cold.  Monitor chewing and swallowing ability.  Continue with routine vision, hearing, dental, and medical screenings.  Give medicines only as directed by the health care provider.  Monitor driving abilities. Do not allow the person to drive when safe driving is no longer possible.  Register with an identification program which could provide location assistance in the event of a missing person situation. SEEK MEDICAL CARE IF:   New behavioral problems start such as moodiness, aggressiveness, or seeing things that are not there (hallucinations).  Any new problem with brain function happens. This includes problems with balance, speech, or  falling a lot.  Problems with swallowing develop.  Any symptoms of other illness happen. Small changes or worsening in any aspect of brain function can be a sign that the illness is getting worse. It can also be a sign of another medical illness such as infection. Seeing a health care provider right away is important. SEEK IMMEDIATE MEDICAL CARE IF:   A fever develops.  New or worsened confusion develops.  New or worsened sleepiness develops.  Staying awake becomes hard to do. Document  Released: 10/29/2000 Document Revised: 09/19/2013 Document Reviewed: 09/30/2010 [facility] Patient Information 2015 [place], [place]. This information is not intended to replace advice given to you by your health care provider. Make sure you discuss any questions you have with your health care provider.   Emergency Department Resource Guide 1) Find a Doctor and Pay Out of Pocket Although you won't have to find out who is covered by your insurance plan, it is a good idea to ask around and get recommendations. You will then need to call the office and see if the doctor you have chosen will accept you as a new patient and what types of options they offer for patients who are self-pay. Some doctors offer discounts or will set up payment plans for their patients who do not have insurance, but you will need to ask so you aren't surprised when you get to your appointment.  2) Contact Your Local Health Department Not all health departments have doctors that can see patients for sick visits, but many do, so it is worth a call to see if yours does. If you don't know where your local health department is, you can check in your phone book. The CDC also has a tool to help you locate your state's health department, and many state websites also have listings of all of their local health departments.  3) Find a Walk-in Clinic If your illness is not likely to be very severe or complicated, you may want to try a walk in clinic.  These are popping up all over the country in pharmacies, drugstores, and shopping centers. They're usually staffed by nurse practitioners or physician assistants that have been trained to treat common illnesses and complaints. They're usually fairly quick and inexpensive. However, if you have serious medical issues or chronic medical problems, these are probably not your best option.  No Primary Care Doctor: - Call Health Connect at  [phone] - they can help you locate a primary care doctor that  accepts your insurance, provides certain services, etc. - Physician Referral Service- [phone]  Chronic Pain Problems: Organization         Address  Phone   Notes  [facility]  [phone] Patients need to be referred by their primary care doctor.   Medication Assistance: Organization         Address  Phone   Notes  [facility] Medication [facility] [address] [place] [place], [place] [address] [phone] --Must be a resident of [facility] -- Must have NO insurance coverage whatsoever (no Medicaid/ Medicare, etc.) -- The pt. MUST have a primary care doctor that directs their care regularly and follows them in the community   [facility]  [phone]   [employer]  [phone]    Agencies that provide inexpensive medical care: Organization         Address  Phone   Notes  [facility]  [phone]   [facility]    [phone]   [facility] [address] [place], [place] [address] [phone]   [facility] [address] [place] [phone]   [facility]anned Parenthood    [phone]   [facility]    [phone]   [facility]  [address], [place] Phone:  [phone], Fax:  [phone] Hours of Operation:  9 am - 6 pm, M-F.  Also  accepts Medicaid/Medicare and self-pay.  [facility]  [address], [place] Phone: [phone], Fax: [phone]. Hours of Operation:  8:30 am - 5:30 pm, M-F.  Also accepts Medicaid and self-pay.  [facility] [place] [address] [place] Phone: [phone]   [facility] [address] Natasha Bence [place], [place] [phone], Ext. [phone] Mondays & Thursdays: 7-9 AM.  First 15 patients are seen on a first come, first serve basis.    Medicaid-accepting [facility] Providers:  Organization         Address  Phone   Notes  [facility] [address], [place] [phone] Also accepts self-pay patients.  [facility] [address] Laurell Josephs [place], [place]  [phone]   [facility] [address], [place] [phone]   [facility] [address], [place] [phone]   Renaye Rakers [address], [place]   [phone] Only accepts [place] patients after they have their name applied to their card.   Self-Pay (no insurance) in [facility]:  Organization         Address  Phone   Notes  Sickle Cell Patients, [facility] [address] [place], [place] [phone]   [facility] [address] [place], [place] [phone]   [facility] [place]  [address],  [phone]   [facility]/Dr. Osei-Bonsu  [address] [place] or [address], [place] [phone] Phone number for both [place] and [place] locations is the same.  [facility] [address] [place] [phone]   [facility] [address], [place] or [address] [phone]   [facility] [address], [place] [phone], phone; [phone], fax Sees patients 1st  and 3rd Saturday of every month.  Must not qualify for public or private insurance (i.e. Medicaid, Medicare, [place] Health Choice, Veterans' Benefits)  Household income should be no more than 200% of the poverty level The clinic cannot treat you if you are pregnant or think you are pregnant  Sexually transmitted diseases are not treated at the clinic.    Dental Care: Organization         Address  Phone  Notes  [facility] Department of [facility] [address] [place], [place] [phone] Accepts children up to age 68 who are enrolled in [place] or [place] Health Choice; pregnant women with a Medicaid card; and children who have applied for Medicaid or [place] Health Choice, but were declined, whose parents can pay a reduced fee at time of service.  [facility]  [address], [place] [phone] Accepts children up to age 64 who are enrolled in [place] or [place] Health Choice; pregnant women with a Medicaid card; and children who have applied for Medicaid or [place] Health Choice, but were declined, whose parents can pay a reduced fee at time of service.  [facility]  [address] [place], [place] [phone] Patients are seen by appointment only. Walk-ins are not accepted. [facility] will see patients 51 years of age and older. Monday - Tuesday (8am-5pm)  Most Wednesdays (8:30-5pm) $30 per visit, cash only  [facility]  [address], [facility] [place] [phone] Patients are seen by appointment only. Walk-ins are not accepted. [facility] will see patients 79 years of age and older. One Wednesday Evening (Monthly: Volunteer Based).  $30 per visit, cash only  [employer] [facility]  [phone] for adults; Children under age 504, call Graduate Pediatric Dentistry at [phone]. Children aged 464-14, please call [phone] to request a pediatric  application.  Dental services are provided in all areas of dental care including fillings, crowns and bridges, complete and partial dentures, implants, gum treatment, root canals, and extractions. Preventive care is also provided. Treatment is provided to both adults and children. Patients are selected via a lottery and there is often a waiting list.   [facility] [address], [place]  [phone] [email or web address]   [facility] [address], [place], [place] [phone], Ext. [phone] Second and Fourth Thursday of each month, opens at 6:30 AM; Clinic ends at 9 AM.  Patients are seen on a first-come first-served basis, and a limited number are seen during each clinic.   [facility]  [address], [place], [place] [phone]   Eligibility Requirements You must have lived in [place], or [place] for at least the last three months.   You cannot be eligible for state or federal sponsored [place] insurance, including [place]IG[place]eterans Administration, [place], or [employer]icare.   You generally cannot be eligible for healthcare insurance through your employer.    How to apply: Eligibility screenings are held every Tuesday and Wednesday afternoon from 1:00 pm until 4:00 pm. You do not need an appointment for the interview!  [facility] [address], [place], [place] [phone]   [facility]  [phone]   [facility]  [phone]   [facility]  [phone]    Behavioral Health Resources in the Community: Intensive Outpatient Programs Organization         Address  Phone  Notes  [facility] [address], [place], [place] [phone]   [facility] [address], [place], [place] [phone]   [facility] [address], [place], [place]  [phone]   [facility]  [address],  [place], [place] [phone] or [phone]   Substance Abuse Resources Organization         Address  Phone  Notes  [facility]  [phone]   [facility]  [phone]   [facility]  [phone]   [facility]  [phone]   [facility]idential [facility] Outpatient Substance Abuse Program  [phone]   Psychological Services Organization         Address  Phone  Notes  [facility]  [phone]- [phone]   [facility]  [phone]- [phone]   [facility] [address], [place] [phone] or [phone]    Mobile Crisis Teams Organization         Address  Phone  Notes  [facility], Mobile Crisis Care Unit  [phone]   [facility]  [address] [place], [place] [phone]   Doristine LocksSharon DeEsch [address] [place] [phone]    Self-Help/Support Groups Organization         Address  Phone  Notes  [facility] - variety of support groups  [phone] Call for more information  Narcotics Anonymous (NA), [facility] [address], [employer] [place]  2 meetings at this location   Statistician         Address  Phone  Notes  [facility] [address],    [place]  [phone]   [facility]  [address] [place] [address], [place], [place] [phone]   [facility] [address] [place], [place] [phone] Admissions: 8am-3pm M-F  [facility] [address]    [place], [place] [phone]   [facility] [address] [place], [place], [place] [phone]   [facility] [address],  [place], [place] [phone]   [facility] Intensive Outpatient [address], [place], [place] [phone]   [facility] ([facility] [address] [place]   [place], [place] [phone] or [phone]   [facility] ([facility]) [address], [place], [place] [phone] Accepts Medicaid  [facility] [address],  [place] [phone] Substance Abuse/Addiction Treatment   [facility] Organization         Address  Phone  Notes  [facility]  [phone]   Angie Fava, PhD [address] [place], [place]   [phone] or [phone]   [facility]   [address] [place], [place] [phone]   [facility] [address] [place], [place] [phone] Insurance/Medicaid/sponsorship through [facility] Families [address]                                    [place], [place] [phone] Therapy/tele-psych/case  [facility] [address], [place] [phone]    Dr. Lolly Mustache  [phone]   [facility]  [employer] [facility] Dept. 1) [address] [place] 2) [address], [place] 3)  [address], [place] [phone]  [phone]   [facility] Child Abuse Hotline ([phone] or [phone] (After Hours)

## 2014-07-14 LAB — BASIC METABOLIC PANEL
BUN: 14 mg/dL (ref 4–21)
CREATININE: 1 mg/dL (ref 0.6–1.3)
Glucose: 170 mg/dL
POTASSIUM: 4.1 mmol/L (ref 3.4–5.3)
SODIUM: 144 mmol/L (ref 137–147)

## 2014-07-17 ENCOUNTER — Other Ambulatory Visit: Payer: Self-pay | Admitting: Nurse Practitioner

## 2014-07-17 DIAGNOSIS — R600 Localized edema: Secondary | ICD-10-CM

## 2014-07-19 ENCOUNTER — Encounter: Payer: Self-pay | Admitting: Nurse Practitioner

## 2014-07-19 ENCOUNTER — Non-Acute Institutional Stay (SKILLED_NURSING_FACILITY): Payer: Medicare Other | Admitting: Nurse Practitioner

## 2014-07-19 DIAGNOSIS — R609 Edema, unspecified: Secondary | ICD-10-CM

## 2014-07-19 DIAGNOSIS — K5900 Constipation, unspecified: Secondary | ICD-10-CM | POA: Diagnosis not present

## 2014-07-19 DIAGNOSIS — G309 Alzheimer's disease, unspecified: Secondary | ICD-10-CM

## 2014-07-19 DIAGNOSIS — E119 Type 2 diabetes mellitus without complications: Secondary | ICD-10-CM | POA: Diagnosis not present

## 2014-07-19 DIAGNOSIS — R600 Localized edema: Secondary | ICD-10-CM

## 2014-07-19 DIAGNOSIS — E0821 Diabetes mellitus due to underlying condition with diabetic nephropathy: Secondary | ICD-10-CM | POA: Diagnosis not present

## 2014-07-19 DIAGNOSIS — F0280 Dementia in other diseases classified elsewhere without behavioral disturbance: Secondary | ICD-10-CM

## 2014-07-19 DIAGNOSIS — K219 Gastro-esophageal reflux disease without esophagitis: Secondary | ICD-10-CM

## 2014-07-19 NOTE — Assessment & Plan Note (Signed)
Chronic, improved edema BLE from 1+ to trace. No cough, sputum production or paroxysmal nocturnal orthopnea. Continue  Furosemide 20mg  Kcl 20meq po daily.  06/19/14 worsened edema w/o pulmonary symptoms. Will increase Furosemide to 40mg  daily. Update BMP.  07/06/14 Bun/creat 20/1.2-Na 148-decrease Furosemide 10mg  and Kcl 2610meq-update BMP in one week.  07/14/14 Bun/creat 14/1.03-Na 144. 07/19/14 trace edema BLE

## 2014-07-19 NOTE — Progress Notes (Signed)
Patient ID: Aaron Farmer, male   DOB: 1930-12-02, 81 y.o.   MRN: [id number]   Code Status: DNR  Allergies  Allergen Reactions  . Namenda [Memantine Hcl] Other (See Comments)    Per MAR  . Penicillins Other (See Comments)    Per [facility]    Chief Complaint  Patient presents with  . Medical Management of Chronic Issues    HPI: Patient is a 82 y.o. male seen in [facility] SNF at [facility] today for evaluation of f/u ED eval for fall and scalp laceration and chronic medical conditions.    07/08/14 ED left parietal laceration with 4 staples closure sustained from falling at [facility].  Problem List Items Addressed This Visit    GERD (gastroesophageal reflux disease)    stable      Edema extremities    Chronic, improved edema BLE from 1+ to trace. No cough, sputum production or paroxysmal nocturnal orthopnea. Continue  Furosemide  Kcl po daily.  06/19/14 worsened edema w/o pulmonary symptoms. Will increase Furosemide to  daily. Update BMP.  07/06/14 Bun/creat 20/1.2-Na 148-decrease Furosemide  and Kcl 63meq-update BMP in one week.  07/14/14 Bun/creat 14/1.03-Na 144. 07/19/14 trace edema BLE       Diabetic renal disease    04/04/14 Bun/creatinine 17/1.2 06/19/14 Bun/creat 20/1.2        Diabetes mellitus type 2, noninsulin dependent    03/29/14 CBGs 187, 149, 169, 156, 169-will start Metformin  po daily with breakfast, continue CBG daily 05/25/14 Hgb A1c 7.6        Constipation    stable-takes Metamucil and Senokot S prn        Alzheimer's disease - Primary    Declining-ADL dependency no longer feeding self-refusal of assistance frequently-extensive assistance with ambulation-continue supportive care.          Review of Systems:  Review of Systems  Constitutional: Negative for fever, chills, weight loss, malaise/fatigue and diaphoresis.  HENT: Positive for hearing loss. Negative for congestion, ear discharge, ear pain, nosebleeds, sore  throat and tinnitus.   Eyes: Negative for blurred vision, double vision, photophobia, pain, discharge and redness.  Respiratory: Negative for cough, hemoptysis, sputum production, shortness of breath, wheezing and stridor.   Cardiovascular: Positive for leg swelling. Negative for chest pain, palpitations, orthopnea, claudication and PND.  Gastrointestinal: Negative for heartburn, nausea, vomiting, abdominal pain, diarrhea, constipation, blood in stool and melena.  Genitourinary: Positive for frequency. Negative for dysuria, urgency, hematuria and flank pain.  Musculoskeletal: Positive for joint pain and falls. Negative for myalgias, back pain and neck pain.  Skin: Negative for itching and rash.       Left parietal scalp laceration healed.   Neurological: Negative for dizziness, tingling, tremors, sensory change, speech change, focal weakness, seizures, loss of consciousness, weakness and headaches.  Endo/Heme/Allergies: Negative for environmental allergies and polydipsia. Does not bruise/bleed easily.  Psychiatric/Behavioral: Positive for depression and memory loss. Negative for suicidal ideas, hallucinations and substance abuse. The patient is nervous/anxious and has insomnia.      Past Medical History  Diagnosis Date  . GERD (gastroesophageal reflux disease)   . Allergy   . Dementia   . Diabetes mellitus without complication   . Hyperlipemia    Medications: Patient's Medications  New Prescriptions   No medications on file  Previous Medications   CALCIUM CARB-CHOLECALCIFEROL (CALCIUM 600 + D) 600-200 MG-UNIT TABS    Take 1 tablet by mouth every morning.    FUROSEMIDE (LASIX) 20 MG TABLET  Take 10 mg by mouth every morning.   LORATADINE (CLARITIN) 10 MG TABLET    Take 10 mg by mouth every morning.    METFORMIN (GLUCOPHAGE) 500 MG TABLET    Take 500 mg by mouth daily with breakfast.   POTASSIUM CHLORIDE (K-DUR) 10 MEQ TABLET    Take 10 mEq by mouth every morning.   PSYLLIUM  (METAMUCIL) 58.6 % PACKET    Take 1 packet by mouth 2 (two) times daily.   PYRIDOXINE (VITAMIN B-6) 100 MG TABLET    Take 100 mg by mouth every morning.    VITAMIN B-12 (CYANOCOBALAMIN) 1000 MCG TABLET    Take 1,000 mcg by mouth every morning.   Modified Medications   No medications on file  Discontinued Medications   No medications on file     Physical Exam: Physical Exam  Constitutional: He appears well-developed and well-nourished. No distress.  HENT:  Head: Normocephalic and atraumatic.  Nose: Nose normal.  Mouth/Throat: Oropharynx is clear and moist. No oropharyngeal exudate.  Eyes: Conjunctivae and EOM are normal. Pupils are equal, round, and reactive to light. Right eye exhibits no discharge. Left eye exhibits no discharge. No scleral icterus.  Neck: Normal range of motion. Neck supple. No JVD present. No thyromegaly present.  Cardiovascular: Normal rate, regular rhythm, normal heart sounds and intact distal pulses.   No murmur heard. Pulmonary/Chest: Effort normal. No respiratory distress. He has no wheezes. He has rales.  bibasilar lung posteriorly.   Abdominal: Soft. Bowel sounds are normal. He exhibits no distension. There is no tenderness. There is no rebound.  Musculoskeletal: Normal range of motion. He exhibits edema. He exhibits no tenderness.  BLE trace  Lymphadenopathy:    He has no cervical adenopathy.  Neurological: He is alert. He has normal reflexes. No cranial nerve deficit. He exhibits normal muscle tone. Coordination normal.  Skin: Skin is warm and dry. No rash (chest, upper back, arms, neck and head/face-dry, scaly, with mild diffuse eczema ) noted. He is not diaphoretic. No erythema. No pallor.  Left parietal scalp laceration healed.    Psychiatric: His mood appears not anxious. His affect is blunt, labile and inappropriate. His affect is not angry. His speech is not rapid and/or pressured, not delayed, not tangential and not slurred. He is slowed. He is not  agitated, not aggressive, not hyperactive, not withdrawn, not actively hallucinating and not combative. Thought content is not paranoid and not delusional. Cognition and memory are impaired. He expresses impulsivity and inappropriate judgment. He exhibits a depressed mood. He is communicative. He exhibits abnormal recent memory and abnormal remote memory. He is attentive.    Filed Vitals:   07/19/14 1201  BP: 130/80  Pulse: 82  Temp: 97.5 F (36.4 C)  TempSrc: Tympanic  Resp: 18   Past procedures:  05/08/15 CT head and cervical spine: IMPRESSION: 1. 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 No acute findings currently. 2. Cervical spondylosis without evidence of cervical fracture. Minimal degenerative anterolisthesis of C5 on C6. 3. Asymmetry of the airway at the level of the vocal cords with soft tissue thickening on the right. This could be a combination of mucoid material, soft tissue lesion and/or relative right-sided vocal cord paralysis.  Labs reviewed: Basic Metabolic Panel:  Recent Labs  16/10/96 06/27/14 07/06/14 07/14/14  NA 138 146 148* 144  K 4.3 3.9 4.2 4.1  BUN CREATININE 0.9 1.0 1.2 1.0  TSH  4.03  --   --   --    Liver Function Tests:  Recent Labs  05/25/14  AST 17  ALT 17  ALKPHOS 74   CBC:  Recent Labs  05/25/14  WBC 7.7  HGB 12.7*  HCT 37*  PLT 280   Lipid Panel:  Recent Labs  11/03/13  CHOL 180  HDL 42  LDLCALC 124  TRIG 68   Assessment/Plan Alzheimer's disease Declining-ADL dependency no longer feeding self-refusal of assistance frequently-extensive assistance with ambulation-continue supportive care.    Diabetes mellitus type 2, noninsulin dependent 03/29/14 CBGs 187, 149, 169, 156, 169-will start Metformin 500mg  po daily with breakfast, continue CBG daily 05/25/14 Hgb A1c 7.6     Edema extremities Chronic, improved edema BLE from 1+ to trace.  No cough, sputum production or paroxysmal nocturnal orthopnea. Continue  Furosemide 20mg  Kcl 20meq po daily.  06/19/14 worsened edema w/o pulmonary symptoms. Will increase Furosemide to 40mg  daily. Update BMP.  07/06/14 Bun/creat 20/1.2-Na 148-decrease Furosemide 10mg  and Kcl 8710meq-update BMP in one week.  07/14/14 Bun/creat 14/1.03-Na 144. 07/19/14 trace edema BLE    Constipation stable-takes Metamucil and Senokot S prn     GERD (gastroesophageal reflux disease) stable   Diabetic renal disease 04/04/14 Bun/creatinine 17/1.2 06/19/14 Bun/creat 20/1.2       Family/ Staff Communication: observe the patient  Goals of Care: SNF  Labs/tests ordered: none

## 2014-07-19 NOTE — Assessment & Plan Note (Signed)
stable °

## 2014-07-19 NOTE — Assessment & Plan Note (Signed)
04/04/14 Bun/creatinine 17/1.2 06/19/14 Bun/creat 20/1.2

## 2014-07-19 NOTE — Assessment & Plan Note (Signed)
Declining-ADL dependency no longer feeding self-refusal of assistance frequently-extensive assistance with ambulation-continue supportive care.

## 2014-07-19 NOTE — Assessment & Plan Note (Signed)
stable-takes Metamucil and Senokot S prn

## 2014-07-19 NOTE — Assessment & Plan Note (Signed)
03/29/14 CBGs 187, 149, 169, 156, 169-will start Metformin 500mg  po daily with breakfast, continue CBG daily 05/25/14 Hgb A1c 7.6

## 2014-08-23 ENCOUNTER — Non-Acute Institutional Stay (SKILLED_NURSING_FACILITY): Payer: Medicare Other | Admitting: Nurse Practitioner

## 2014-08-23 ENCOUNTER — Encounter: Payer: Self-pay | Admitting: Nurse Practitioner

## 2014-08-23 DIAGNOSIS — E119 Type 2 diabetes mellitus without complications: Secondary | ICD-10-CM

## 2014-08-23 DIAGNOSIS — L850 Acquired ichthyosis: Secondary | ICD-10-CM

## 2014-08-23 DIAGNOSIS — G309 Alzheimer's disease, unspecified: Secondary | ICD-10-CM | POA: Diagnosis not present

## 2014-08-23 DIAGNOSIS — E1121 Type 2 diabetes mellitus with diabetic nephropathy: Secondary | ICD-10-CM | POA: Diagnosis not present

## 2014-08-23 DIAGNOSIS — R609 Edema, unspecified: Secondary | ICD-10-CM | POA: Diagnosis not present

## 2014-08-23 DIAGNOSIS — F0280 Dementia in other diseases classified elsewhere without behavioral disturbance: Secondary | ICD-10-CM

## 2014-08-23 DIAGNOSIS — R600 Localized edema: Secondary | ICD-10-CM

## 2014-08-23 DIAGNOSIS — K5900 Constipation, unspecified: Secondary | ICD-10-CM

## 2014-08-23 DIAGNOSIS — L853 Xerosis cutis: Secondary | ICD-10-CM

## 2014-08-23 DIAGNOSIS — K219 Gastro-esophageal reflux disease without esophagitis: Secondary | ICD-10-CM

## 2014-08-23 NOTE — Assessment & Plan Note (Signed)
Loratadine helps.

## 2014-08-23 NOTE — Assessment & Plan Note (Signed)
stable-takes Metamucil and Senokot S prn

## 2014-08-23 NOTE — Assessment & Plan Note (Signed)
04/04/14 Bun/creatinine 17/1.2 06/19/14 Bun/creat 20/1.2

## 2014-08-23 NOTE — Progress Notes (Signed)
Patient ID: Aaron Farmer, male   DOB: [birth year], 81 y.o.   MRN: [id number]   Code Status: DNR  Allergies  Allergen Reactions  . Namenda [Memantine Hcl] Other (See Comments)    Per MAR  . Penicillins Other (See Comments)    Per [facility]    Chief Complaint  Patient presents with  . Medical Management of Chronic Issues    HPI: Patient is a 81 y.o. male seen in [facility] SN[facility] at [facility] today for evaluation of chronic medical conditions.  Problem List Items Addressed This Visit    GERD (gastroesophageal reflux disease)    stable       Alzheimer's disease    Declining-ADL dependency no longer feeding self-refusal of assistance frequently-extensive assistance with ambulation-continue supportive care.       Constipation    stable-takes Metamucil and Senokot S prn        Dry skin dermatitis    Loratadine helps.       Edema extremities    Chronic, improved edema BLE from 1+ to trace. No cough, sputum production or paroxysmal nocturnal orthopnea. Continue  Furosemide  Kcl po daily.  06/19/14 worsened edema w/o pulmonary symptoms. Will increase Furosemide to  daily. Update BMP.  07/06/14 Bun/creat 20/1.2-Na 148-decrease Furosemide  and Kcl 49meq-update BMP in one week.  07/14/14 Bun/creat 14/1.03-Na 144. 07/19/14 trace edema BLE 08/23/14 only trace edema seen        Diabetes mellitus type 2, noninsulin dependent - Primary    03/29/14 CBGs 187, 149, 169, 156, 169-will start Metformin  po daily with breakfast, continue CBG daily 05/25/14 Hgb A1c 7.6        Diabetic renal disease    04/04/14 Bun/creatinine 17/1.2 06/19/14 Bun/creat 20/1.2            Review of Systems:  Review of Systems  Constitutional: Negative for fever, chills and diaphoresis.  HENT: Positive for hearing loss. Negative for congestion, ear discharge, ear pain, nosebleeds, sore throat and tinnitus.   Eyes: Negative for photophobia, pain, discharge and redness.    Respiratory: Negative for cough, shortness of breath, wheezing and stridor.   Cardiovascular: Positive for leg swelling. Negative for chest pain and palpitations.  Gastrointestinal: Negative for nausea, vomiting, abdominal pain, diarrhea, constipation and blood in stool.  Endocrine: Negative for polydipsia.  Genitourinary: Positive for frequency and flank pain. Negative for dysuria, urgency and hematuria.  Musculoskeletal: Negative for myalgias, back pain and neck pain.  Skin: Negative for rash.       Left parietal scalp laceration healed.   Allergic/Immunologic: Negative for environmental allergies.  Neurological: Negative for dizziness, tremors, seizures, weakness and headaches.  Hematological: Does not bruise/bleed easily.  Psychiatric/Behavioral: Negative for suicidal ideas and hallucinations. The patient is nervous/anxious.      Past Medical History  Diagnosis Date  . GERD (gastroesophageal reflux disease)   . Allergy   . Dementia   . Diabetes mellitus without complication   . Hyperlipemia    Past Surgical History  Procedure Laterality Date  . Hernia repair     Social History:   reports that he does not drink alcohol. His tobacco and drug histories are not on file.  No family history on file.  Medications: Patient's Medications  New Prescriptions   No medications on file  Previous Medications   CALCIUM CARB-CHOLECALCIFEROL (CALCIUM 600 + D) 600-200 MG-UNIT TABS    Take 1 tablet by mouth every morning.    FUROSEMIDE (LASIX) 20 MG TABLET  Take 10 mg by mouth every morning.   LORATADINE (CLARITIN) 10 MG TABLET    Take 10 mg by mouth every morning.    METFORMIN (GLUCOPHAGE) 500 MG TABLET    Take 500 mg by mouth daily with breakfast.   POTASSIUM CHLORIDE (K-DUR) 10 MEQ TABLET    Take 10 mEq by mouth every morning.   PSYLLIUM (METAMUCIL) 58.6 % PACKET    Take 1 packet by mouth 2 (two) times daily.   PYRIDOXINE (VITAMIN B-6) 100 MG TABLET    Take 100 mg by mouth every  morning.    VITAMIN B-12 (CYANOCOBALAMIN) 1000 MCG TABLET    Take 1,000 mcg by mouth every morning.   Modified Medications   No medications on file  Discontinued Medications   No medications on file     Physical Exam: Physical Exam  Constitutional: He appears well-developed and well-nourished. No distress.  HENT:  Head: Normocephalic and atraumatic.  Nose: Nose normal.  Mouth/Throat: Oropharynx is clear and moist. No oropharyngeal exudate.  Eyes: Conjunctivae and EOM are normal. Pupils are equal, round, and reactive to light. Right eye exhibits no discharge. Left eye exhibits no discharge. No scleral icterus.  Neck: Normal range of motion. Neck supple. No JVD present. No thyromegaly present.  Cardiovascular: Normal rate, regular rhythm, normal heart sounds and intact distal pulses.   No murmur heard. Pulmonary/Chest: Effort normal. No respiratory distress. He has no wheezes. He has rales.  bibasilar lung posteriorly.   Abdominal: Soft. Bowel sounds are normal. He exhibits no distension. There is no tenderness. There is no rebound.  Musculoskeletal: Normal range of motion. He exhibits edema. He exhibits no tenderness.  BLE trace  Lymphadenopathy:    He has no cervical adenopathy.  Neurological: He is alert. He has normal reflexes. No cranial nerve deficit. He exhibits normal muscle tone. Coordination normal.  Skin: Skin is warm and dry. No rash (chest, upper back, arms, neck and head/face-dry, scaly, with mild diffuse eczema ) noted. He is not diaphoretic. No erythema. No pallor.  Left parietal scalp laceration healed.    Psychiatric: His mood appears not anxious. His affect is blunt, labile and inappropriate. His affect is not angry. His speech is not rapid and/or pressured, not delayed, not tangential and not slurred. He is slowed. He is not agitated, not aggressive, not hyperactive, not withdrawn, not actively hallucinating and not combative. Thought content is not paranoid and not  delusional. Cognition and memory are impaired. He expresses impulsivity and inappropriate judgment. He exhibits a depressed mood. He is communicative. He exhibits abnormal recent memory and abnormal remote memory. He is attentive.    Filed Vitals:   08/23/14 1453  BP: 122/75  Pulse: 80  Temp: 97.9 F (36.6 C)  TempSrc: Tympanic  Resp: 18      Labs reviewed: Basic Metabolic Panel:  Recent Labs  16/10/96 06/27/14 07/06/14 07/14/14  NA 138 146 148* 144  K 4.3 3.9 4.2 4.1  BUN CREATININE 0.9 1.0 1.2 1.0  TSH 4.03  --   --   --    Liver Function Tests:  Recent Labs  05/25/14  AST 17  ALT 17  ALKPHOS 74   No results for input(s): LIPASE, AMYLASE in the last 8760 hours. No results for input(s): AMMONIA in the last 8760 hours. CBC:  Recent Labs  05/25/14  WBC 7.7  HGB 12.7*  HCT 37*  PLT 280   Lipid Panel:  Recent Labs  11/03/13  CHOL 180  HDL 42  LDLCALC 124  TRIG 68    Past Procedures:  05/08/15 CT head and cervical spine: IMPRESSION: 1. 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 No acute findings currently. 2. Cervical spondylosis without evidence of cervical fracture. Minimal degenerative anterolisthesis of C5 on C6. 3. Asymmetry of the airway at the level of the vocal cords with soft tissue thickening on the right. This could be a combination of mucoid material, soft tissue lesion and/or relative right-sided vocal cord paralysis.   Assessment/Plan Diabetes mellitus type 2, noninsulin dependent 03/29/14 CBGs 187, 149, 169, 156, 169-will start Metformin 500mg  po daily with breakfast, continue CBG daily 05/25/14 Hgb A1c 7.6     Edema extremities Chronic, improved edema BLE from 1+ to trace. No cough, sputum production or paroxysmal nocturnal orthopnea. Continue  Furosemide 20mg  Kcl 20meq po daily.  06/19/14 worsened edema w/o pulmonary symptoms. Will increase Furosemide  to 40mg  daily. Update BMP.  07/06/14 Bun/creat 20/1.2-Na 148-decrease Furosemide 10mg  and Kcl 2110meq-update BMP in one week.  07/14/14 Bun/creat 14/1.03-Na 144. 07/19/14 trace edema BLE 08/23/14 only trace edema seen     Dry skin dermatitis Loratadine helps.    Diabetic renal disease 04/04/14 Bun/creatinine 17/1.2 06/19/14 Bun/creat 20/1.2      GERD (gastroesophageal reflux disease) stable    Alzheimer's disease Declining-ADL dependency no longer feeding self-refusal of assistance frequently-extensive assistance with ambulation-continue supportive care.    Constipation stable-takes Metamucil and Senokot S prn       Family/ Staff Communication: observe the patient  Goals of Care: SNF  Labs/tests ordered: none

## 2014-08-23 NOTE — Assessment & Plan Note (Signed)
Declining-ADL dependency no longer feeding self-refusal of assistance frequently-extensive assistance with ambulation-continue supportive care.

## 2014-08-23 NOTE — Assessment & Plan Note (Signed)
Chronic, improved edema BLE from 1+ to trace. No cough, sputum production or paroxysmal nocturnal orthopnea. Continue  Furosemide 20mg  Kcl 20meq po daily.  06/19/14 worsened edema w/o pulmonary symptoms. Will increase Furosemide to 40mg  daily. Update BMP.  07/06/14 Bun/creat 20/1.2-Na 148-decrease Furosemide 10mg  and Kcl 610meq-update BMP in one week.  07/14/14 Bun/creat 14/1.03-Na 144. 07/19/14 trace edema BLE 08/23/14 only trace edema seen

## 2014-08-23 NOTE — Assessment & Plan Note (Signed)
stable °

## 2014-08-23 NOTE — Assessment & Plan Note (Signed)
03/29/14 CBGs 187, 149, 169, 156, 169-will start Metformin 500mg  po daily with breakfast, continue CBG daily 05/25/14 Hgb A1c 7.6

## 2014-09-11 ENCOUNTER — Encounter: Payer: Self-pay | Admitting: Nurse Practitioner

## 2014-09-11 ENCOUNTER — Non-Acute Institutional Stay (SKILLED_NURSING_FACILITY): Payer: Medicare Other | Admitting: Nurse Practitioner

## 2014-09-11 ENCOUNTER — Other Ambulatory Visit: Payer: Self-pay | Admitting: Nurse Practitioner

## 2014-09-11 DIAGNOSIS — E119 Type 2 diabetes mellitus without complications: Secondary | ICD-10-CM | POA: Diagnosis not present

## 2014-09-11 DIAGNOSIS — R600 Localized edema: Secondary | ICD-10-CM

## 2014-09-11 DIAGNOSIS — R609 Edema, unspecified: Secondary | ICD-10-CM | POA: Diagnosis not present

## 2014-09-11 DIAGNOSIS — R0682 Tachypnea, not elsewhere classified: Secondary | ICD-10-CM

## 2014-09-11 DIAGNOSIS — R5383 Other fatigue: Secondary | ICD-10-CM | POA: Diagnosis not present

## 2014-09-11 DIAGNOSIS — K5900 Constipation, unspecified: Secondary | ICD-10-CM

## 2014-09-11 DIAGNOSIS — R000 Tachycardia, unspecified: Secondary | ICD-10-CM | POA: Diagnosis not present

## 2014-09-11 DIAGNOSIS — R509 Fever, unspecified: Secondary | ICD-10-CM

## 2014-09-11 DIAGNOSIS — G309 Alzheimer's disease, unspecified: Secondary | ICD-10-CM | POA: Diagnosis not present

## 2014-09-11 DIAGNOSIS — F0280 Dementia in other diseases classified elsewhere without behavioral disturbance: Secondary | ICD-10-CM

## 2014-09-11 LAB — CBC AND DIFFERENTIAL
HCT: 38 % — AB (ref 41–53)
Hemoglobin: 13.1 g/dL — AB (ref 13.5–17.5)
Platelets: 276 10*3/uL (ref 150–399)
WBC: 20.9 10^3/mL

## 2014-09-11 LAB — BASIC METABOLIC PANEL
BUN: 27 mg/dL — AB (ref 4–21)
Creatinine: 1.3 mg/dL (ref 0.6–1.3)
GLUCOSE: 223 mg/dL
POTASSIUM: 4.4 mmol/L (ref 3.4–5.3)
SODIUM: 141 mmol/L (ref 137–147)

## 2014-09-11 LAB — HEPATIC FUNCTION PANEL
ALK PHOS: 83 U/L (ref 25–125)
ALT: 22 U/L (ref 10–40)
AST: 47 U/L — AB (ref 14–40)
Bilirubin, Total: 1.3 mg/dL

## 2014-09-11 NOTE — Assessment & Plan Note (Signed)
T 103.7, lethargy, RR 25-30, HR 100-110. Obtain CXR urine culture CBC, CMP. Empirical ABX Rocephin 1gm bid x 7 days.

## 2014-09-11 NOTE — Assessment & Plan Note (Signed)
Hold Metamucil-unable to take oral meds.

## 2014-09-11 NOTE — Assessment & Plan Note (Signed)
HR 108 regular, likely related to elevated T, will continue to monitor VS

## 2014-09-11 NOTE — Assessment & Plan Note (Signed)
No edema noted. Very limited oral intake presently. Hold Furosemide. Update CMP

## 2014-09-11 NOTE — Assessment & Plan Note (Signed)
RR 25-30, O2 94% with O2 2lpm via [place], obtain CXR, monitor O2 sat.

## 2014-09-11 NOTE — Assessment & Plan Note (Signed)
09/11/14 hold oral meds. Will IVF, ABX, labs[facility].

## 2014-09-11 NOTE — Progress Notes (Signed)
Patient ID: Aaron OvermanWilliam Farmer, male   DOB: 11-20-1930, 81 y.o.   MRN: [id number]   Code Status: DNR  Allergies  Allergen Reactions  . Namenda [Memantine Hcl] Other (See Comments)    Per [facility]  . Penicillins Other (See Comments)    [facility]    Chief Complaint  Patient presents with  . Medical Management of Chronic Issues  . Acute Visit    fever, lethargy, tachycardia, O2 desaturation.     HPI: Patient is a 81 y.o. male [facility] today for evaluation of fever, HR 100-110, RR 25-30, O2 desaturation, lethargy, and chronic medical conditions.  Problem List Items Addressed This Visit    Alzheimer's disease    09/11/14 hold oral meds. Will IVF, ABX, labs, [facility] with POA's consent.       Constipation    Hold Metamucil-unable to take oral meds.       Edema extremities    No edema noted. Very limited oral intake presently. Hold Furosemide. Update CMP      Diabetes mellitus type 2, noninsulin dependent    Hold Metformin, SSI to cover CBG ac meals due to inconsistent oral intake.       Fever in adult - Primary    T 103.7, lethargy, RR 25-30, HR 100-110. Obtain CXR urine culture CBC, CMP. Empirical ABX Rocephin 1gm bid x 7 days.       Tachycardia with 100 - 120 beats per minute    HR 108 regular, likely related to elevated T, will continue to monitor VS       Tachypnea    RR 25-30, O2 94% with O2 2lpm via , obtain CXR, monitor O2 sat.       Lethargy    W/o focal neurological deficit. Poor oral intake in the past 24 hours. Will hold Metformin/Cal/Furosemide/Kcl/Claritin/Vit B6 and 12/Metamucil. IVF 1/2 NS 100cc/hr. Update CMP         Review of Systems:  Review of Systems  Constitutional: Positive for fever, chills, activity change, appetite change and fatigue. Negative for diaphoresis.  HENT: Positive for hearing loss. Negative for congestion, ear discharge, ear pain, nosebleeds, sore throat and tinnitus.   Eyes: Negative for  photophobia, pain, discharge and redness.  Respiratory: Negative for cough, shortness of breath, wheezing and stridor.        Tachypnea  Cardiovascular: Negative for chest pain, palpitations and leg swelling.       HR 100-110  Gastrointestinal: Negative for nausea, vomiting, abdominal pain, diarrhea, constipation and blood in stool.  Endocrine: Negative for polydipsia.  Genitourinary: Positive for frequency. Negative for dysuria, urgency, hematuria and flank pain.  Musculoskeletal: Negative for myalgias, back pain and neck pain.  Skin: Negative for rash.       Left parietal scalp laceration healed.   Allergic/Immunologic: Negative for environmental allergies.  Neurological: Negative for dizziness, tremors, seizures, weakness and headaches.  Hematological: Does not bruise/bleed easily.  Psychiatric/Behavioral: Negative for suicidal ideas and hallucinations. The patient is nervous/anxious.      Past Medical History  Diagnosis Date  . GERD (gastroesophageal reflux disease)   . Allergy   . Dementia   . Diabetes mellitus without complication   . Hyperlipemia    Past Surgical History  Procedure Laterality Date  . Hernia repair     Social History:   reports that he does not drink alcohol. His tobacco and drug histories are not on file.  No family history on  file.  Medications: Patient's Medications  New Prescriptions   No medications on file  Previous Medications   CALCIUM CARB-CHOLECALCIFEROL (CALCIUM 600 + D) 600-200 MG-UNIT TABS    Take 1 tablet by mouth every morning.    FUROSEMIDE (LASIX) 20 MG TABLET    Take 10 mg by mouth every morning.   LORATADINE (CLARITIN) 10 MG TABLET    Take 10 mg by mouth every morning.    METFORMIN (GLUCOPHAGE) 500 MG TABLET    Take 500 mg by mouth daily with breakfast.   POTASSIUM CHLORIDE (K-DUR) 10 MEQ TABLET    Take 10 mEq by mouth every morning.   PSYLLIUM (METAMUCIL) 58.6 % PACKET    Take 1 packet by mouth 2 (two) times daily.   PYRIDOXINE  (VITAMIN B-6) 100 MG TABLET    Take 100 mg by mouth every morning.    VITAMIN B-12 (CYANOCOBALAMIN) 1000 MCG TABLET    Take 1,000 mcg by mouth every morning.   Modified Medications   No medications on file  Discontinued Medications   No medications on file     Physical Exam: Physical Exam  Constitutional: He appears well-developed and well-nourished. No distress.  HENT:  Head: Normocephalic and atraumatic.  Nose: Nose normal.  Mouth/Throat: Oropharynx is clear and moist. No oropharyngeal exudate.  Eyes: Conjunctivae and EOM are normal. Pupils are equal, round, and reactive to light. Right eye exhibits no discharge. Left eye exhibits no discharge. No scleral icterus.  Neck: Normal range of motion. Neck supple. No JVD present. No thyromegaly present.  Cardiovascular: Normal rate, regular rhythm, normal heart sounds and intact distal pulses.   No murmur heard. HR 100-110   Pulmonary/Chest: Effort normal. No respiratory distress. He has no wheezes. He has rales.  bibasilar lung posteriorly.   Abdominal: Soft. Bowel sounds are normal. He exhibits no distension. There is no tenderness. There is no rebound.  Musculoskeletal: Normal range of motion. He exhibits no edema or tenderness.  Lymphadenopathy:    He has no cervical adenopathy.  Neurological: He is alert. He has normal reflexes. No cranial nerve deficit. He exhibits normal muscle tone. Coordination normal.  Skin: Skin is warm and dry. No rash (chest, upper back, arms, neck and head/face-dry, scaly, with mild diffuse eczema ) noted. He is not diaphoretic. No erythema. No pallor.  Left parietal scalp laceration healed.    Psychiatric: His mood appears not anxious. His affect is blunt, labile and inappropriate. His affect is not angry. His speech is not rapid and/or pressured, not delayed, not tangential and not slurred. He is slowed. He is not agitated, not aggressive, not hyperactive, not withdrawn, not actively hallucinating and not  combative. Thought content is not paranoid and not delusional. Cognition and memory are impaired. He expresses impulsivity and inappropriate judgment. He exhibits a depressed mood. He is communicative. He exhibits abnormal recent memory and abnormal remote memory. He is attentive.    Filed Vitals:   09/11/14 1126  BP: 136/82  Pulse: 108  Temp: 103.7 F (39.8 C)  TempSrc: Tympanic  Resp: 28      Labs reviewed: Basic Metabolic Panel:  Recent Labs  16/10/96 06/27/14 07/06/14 07/14/14  NA 138 146 148* 144  K 4.3 3.9 4.2 4.1  BUN CREATININE 0.9 1.0 1.2 1.0  TSH 4.03  --   --   --    Liver Function Tests:  Recent Labs  05/25/14  AST 17  ALT 17  ALKPHOS 74  No results for input(s): LIPASE, AMYLASE in the last 8760 hours. No results for input(s): AMMONIA in the last 8760 hours. CBC:  Recent Labs  05/25/14  WBC 7.7  HGB 12.7*  HCT 37*  PLT 280   Lipid Panel:  Recent Labs  11/03/13  CHOL 180  HDL 42  LDLCALC 124  TRIG 68    Past Procedures:  05/08/15 CT head and cervical spine: IMPRESSION: 1. 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 No acute findings currently. 2. Cervical spondylosis without evidence of cervical fracture. Minimal degenerative anterolisthesis of C5 on C6. 3. Asymmetry of the airway at the level of the vocal cords with soft tissue thickening on the right. This could be a combination of mucoid material, soft tissue lesion and/or relative right-sided vocal cord paralysis.   Assessment/Plan Fever in adult T 103.7, lethargy, RR 25-30, HR 100-110. Obtain CXR urine culture CBC, CMP. Empirical ABX Rocephin 1gm bid x 7 days.    Tachycardia with 100 - 120 beats per minute HR 108 regular, likely related to elevated T, will continue to monitor VS    Tachypnea RR 25-30, O2 94% with O2 2lpm via [place], obtain CXR, monitor O2 sat.    Lethargy W/o focal neurological  deficit. Poor oral intake in the past 24 hours. Will hold Metformin/Cal/Furosemide/Kcl/Claritin/Vit B6 and 12/Metamucil. IVF 1/2 NS 100cc/hr. Update CMP   Diabetes mellitus type 2, noninsulin dependent Hold Metformin, SSI to cover CBG ac meals due to inconsistent oral intake.    Edema extremities No edema noted. Very limited oral intake presently. Hold Furosemide. Update CMP   Constipation Hold Metamucil-unable to take oral meds.    Alzheimer's disease 09/11/14 hold oral meds. Will IVF, ABX, labs, CXR done at [facility] with POA's consent.      Family/ Staff Communication: observe the patient  Goals of Care: SNF  Labs/tests ordered: CBC, CMP, UA C/S, CXR, VS and Sat O2 q shift.

## 2014-09-11 NOTE — Assessment & Plan Note (Signed)
W/o focal neurological deficit. Poor oral intake in the past 24 hours. Will hold Metformin/Cal/Furosemide/Kcl/Claritin/Vit B6 and 12/Metamucil. IVF 1/2 NS 100cc/hr. Update CMP

## 2014-09-11 NOTE — Assessment & Plan Note (Signed)
Hold Metformin, SSI to cover CBG ac meals due to inconsistent oral intake.

## 2014-09-12 LAB — BASIC METABOLIC PANEL
BUN: 25 mg/dL — AB (ref 4–21)
CREATININE: 0.9 mg/dL (ref 0.6–1.3)
GLUCOSE: 173 mg/dL
POTASSIUM: 4.1 mmol/L (ref 3.4–5.3)
Sodium: 141 mmol/L (ref 137–147)

## 2014-09-13 ENCOUNTER — Encounter: Payer: Self-pay | Admitting: Nurse Practitioner

## 2014-09-13 ENCOUNTER — Non-Acute Institutional Stay (SKILLED_NURSING_FACILITY): Payer: Medicare Other | Admitting: Nurse Practitioner

## 2014-09-13 DIAGNOSIS — K5900 Constipation, unspecified: Secondary | ICD-10-CM

## 2014-09-13 DIAGNOSIS — D72829 Elevated white blood cell count, unspecified: Secondary | ICD-10-CM | POA: Diagnosis not present

## 2014-09-13 DIAGNOSIS — R609 Edema, unspecified: Secondary | ICD-10-CM | POA: Diagnosis not present

## 2014-09-13 DIAGNOSIS — E119 Type 2 diabetes mellitus without complications: Secondary | ICD-10-CM | POA: Diagnosis not present

## 2014-09-13 DIAGNOSIS — G309 Alzheimer's disease, unspecified: Secondary | ICD-10-CM | POA: Diagnosis not present

## 2014-09-13 DIAGNOSIS — N400 Enlarged prostate without lower urinary tract symptoms: Secondary | ICD-10-CM | POA: Diagnosis not present

## 2014-09-13 DIAGNOSIS — R509 Fever, unspecified: Secondary | ICD-10-CM

## 2014-09-13 DIAGNOSIS — R5383 Other fatigue: Secondary | ICD-10-CM

## 2014-09-13 DIAGNOSIS — R600 Localized edema: Secondary | ICD-10-CM

## 2014-09-13 DIAGNOSIS — F0280 Dementia in other diseases classified elsewhere without behavioral disturbance: Secondary | ICD-10-CM

## 2014-09-13 NOTE — Assessment & Plan Note (Signed)
Improved since IVF and Rocephin.

## 2014-09-13 NOTE — Assessment & Plan Note (Signed)
Stable, no urinary retention.  

## 2014-09-13 NOTE — Progress Notes (Signed)
Patient ID: Aaron Farmer, male   DOB: 12-14-1930, 81 y.o.   MRN: [id number]   Code Status: DNR  Allergies  Allergen Reactions  . Namenda [Memantine Hcl] Other (See Comments)    Per MAR  . Penicillins Other (See Comments)    Per [facility]    Chief Complaint  Patient presents with  . Medical Management of Chronic Issues  . Acute Visit    leukocytosis    HPI: Patient is a 81 y.o. male seen in the SNF at [facility] today for evaluation of leukocytosis and chronic medical conditions.   Fever is resolved, HR is at baseline of 90s, he is more alert, and oral intake is improved after IVF and Rocephin initiated.  Problem List Items Addressed This Visit    Alzheimer's disease    Supportive measures.       Constipation    MOM prn       BPH (benign prostatic hyperplasia)    Stable, no urinary retention.       Edema extremities    Not apparent, continue hold Furosemide.       Diabetes mellitus type 2, noninsulin dependent    Continue to hold Metformin and SSI for CBG control.       Fever in adult    Resolved.      Lethargy    Improved since IVF and Rocephin.       Leukocytosis - Primary      Review of Systems:  Review of Systems  Constitutional: Positive for fatigue. Negative for fever, chills, diaphoresis, activity change and appetite change.  HENT: Positive for hearing loss. Negative for congestion, ear discharge, ear pain, nosebleeds, sore throat and tinnitus.   Eyes: Negative for photophobia, pain, discharge and redness.  Respiratory: Negative for cough, shortness of breath, wheezing and stridor.   Cardiovascular: Negative for chest pain, palpitations and leg swelling.  Gastrointestinal: Negative for nausea, vomiting, abdominal pain, diarrhea, constipation and blood in stool.  Endocrine: Negative for polydipsia.  Genitourinary: Positive for frequency. Negative for dysuria, urgency, hematuria and flank pain.  Musculoskeletal: Negative for myalgias,  back pain and neck pain.  Skin: Negative for rash.  Allergic/Immunologic: Negative for environmental allergies.  Neurological: Negative for dizziness, tremors, seizures, weakness and headaches.  Hematological: Does not bruise/bleed easily.  Psychiatric/Behavioral: Negative for suicidal ideas and hallucinations. The patient is nervous/anxious.      Past Medical History  Diagnosis Date  . GERD (gastroesophageal reflux disease)   . Allergy   . Dementia   . Diabetes mellitus without complication   . Hyperlipemia    Past Surgical History  Procedure Laterality Date  . Hernia repair     Social History:   reports that he does not drink alcohol. His tobacco and drug histories are not on file.  No family history on file.  Medications: Patient's Medications  New Prescriptions   No medications on file  Previous Medications   CALCIUM CARB-CHOLECALCIFEROL (CALCIUM 600 + D) 600-200 MG-UNIT TABS    Take 1 tablet by mouth every morning.    FUROSEMIDE (LASIX) 20 MG TABLET    Take 10 mg by mouth every morning.   LORATADINE (CLARITIN) 10 MG TABLET    Take 10 mg by mouth every morning.    METFORMIN (GLUCOPHAGE) 500 MG TABLET    Take 500 mg by mouth daily with breakfast.   POTASSIUM CHLORIDE (K-DUR) 10 MEQ TABLET    Take 10 mEq by mouth every morning.   PSYLLIUM (METAMUCIL) 58.6 %  PACKET    Take 1 packet by mouth 2 (two) times daily.   PYRIDOXINE (VITAMIN B-6) 100 MG TABLET    Take 100 mg by mouth every morning.    VITAMIN B-12 (CYANOCOBALAMIN) 1000 MCG TABLET    Take 1,000 mcg by mouth every morning.   Modified Medications   No medications on file  Discontinued Medications   No medications on file     Physical Exam: Physical Exam  Constitutional: He appears well-developed and well-nourished. No distress.  HENT:  Head: Normocephalic and atraumatic.  Nose: Nose normal.  Mouth/Throat: Oropharynx is clear and moist. No oropharyngeal exudate.  Eyes: Conjunctivae and EOM are normal. Pupils  are equal, round, and reactive to light. Right eye exhibits no discharge. Left eye exhibits no discharge. No scleral icterus.  Neck: Normal range of motion. Neck supple. No JVD present. No thyromegaly present.  Cardiovascular: Normal rate, regular rhythm, normal heart sounds and intact distal pulses.   No murmur heard. Pulmonary/Chest: Effort normal. No respiratory distress. He has no wheezes. He has rales.  bibasilar lung posteriorly.   Abdominal: Soft. Bowel sounds are normal. He exhibits no distension. There is no tenderness. There is no rebound.  Musculoskeletal: Normal range of motion. He exhibits no edema or tenderness.  Lymphadenopathy:    He has no cervical adenopathy.  Neurological: He is alert. He has normal reflexes. No cranial nerve deficit. He exhibits normal muscle tone. Coordination normal.  Skin: Skin is warm and dry. No rash (chest, upper back, arms, neck and head/face-dry, scaly, with mild diffuse eczema ) noted. He is not diaphoretic. No erythema. No pallor.     Psychiatric: His mood appears not anxious. His affect is blunt, labile and inappropriate. His affect is not angry. His speech is not rapid and/or pressured, not delayed, not tangential and not slurred. He is slowed. He is not agitated, not aggressive, not hyperactive, not withdrawn, not actively hallucinating and not combative. Thought content is not paranoid and not delusional. Cognition and memory are impaired. He expresses impulsivity and inappropriate judgment. He exhibits a depressed mood. He is communicative. He exhibits abnormal recent memory and abnormal remote memory. He is attentive.    Filed Vitals:   09/13/14 1308  BP: 122/68  Pulse: 92  Temp: 99.4 F (37.4 C)  TempSrc: Tympanic  Resp: 18      Labs reviewed: Basic Metabolic Panel:  Recent Labs  78/29/5599/12/02  07/14/14 09/11/14 09/12/14  NA 138  < > 144 141 141  K 4.3  < > 4.1 4.4 4.1  BUN 10  < > 14 27* 25*  CREATININE 0.9  < > 1.0 1.3 0.9  TSH  4.03  --   --   --   --   < > = values in this interval not displayed. Liver Function Tests:  Recent Labs  05/25/14 09/11/14  AST 17 47*  ALT 17 22  ALKPHOS 74 83   No results for input(s): LIPASE, AMYLASE in the last 8760 hours. No results for input(s): AMMONIA in the last 8760 hours. CBC:  Recent Labs  05/25/14 09/11/14  WBC 7.7 20.9  HGB 12.7* 13.1*  HCT 37* 38*  PLT 280 276   Lipid Panel:  Recent Labs  11/03/13  CHOL 180  HDL 42  LDLCALC 124  TRIG 68    Past Procedures:  05/08/15 CT head and cervical spine: IMPRESSION: 1. 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 No acute  findings currently. 2. Cervical spondylosis without evidence of cervical fracture. Minimal degenerative anterolisthesis of C5 on C6. 3. Asymmetry of the airway at the level of the vocal cords with soft tissue thickening on the right. This could be a combination of mucoid material, soft tissue lesion and/or relative right-sided vocal cord paralysis.  09/11/14 CXR no obvious acute cardiopulmonary pathology  Assessment/Plan Lethargy Improved since IVF and Rocephin.    Edema extremities Not apparent, continue hold Furosemide.    Diabetes mellitus type 2, noninsulin dependent Continue to hold Metformin and SSI for CBG control.    Alzheimer's disease Supportive measures.    Constipation MOM prn    BPH (benign prostatic hyperplasia) Stable, no urinary retention.    Fever in adult Resolved.     Family/ Staff Communication: observe the patient  Goals of Care: SNF  Labs/tests ordered: none

## 2014-09-13 NOTE — Assessment & Plan Note (Signed)
Not apparent, continue hold Furosemide.

## 2014-09-13 NOTE — Assessment & Plan Note (Signed)
MOM prn 

## 2014-09-13 NOTE — Assessment & Plan Note (Signed)
Resolved

## 2014-09-13 NOTE — Assessment & Plan Note (Signed)
Supportive measures 

## 2014-09-13 NOTE — Assessment & Plan Note (Signed)
Continue to hold Metformin and SSI for CBG control.

## 2014-09-14 ENCOUNTER — Encounter: Payer: Self-pay | Admitting: Nurse Practitioner

## 2014-09-17 DEATH — deceased

## 2016-08-30 IMAGING — CT CT HEAD W/O CM
3 of 4 series · 15 of 30 positions shown, 18 images · non-contrast
Comparison: CT head on 08/03/2005

CLINICAL DATA: Fall at [HOSPITAL] with head injury. Found down on
floor.

EXAM:
CT HEAD WITHOUT CONTRAST
CT CERVICAL SPINE WITHOUT CONTRAST
TECHNIQUE: Multidetector CT imaging of the head and cervical spine was
performed following the standard protocol without intravenous
contrast. Multiplanar CT image reconstructions of the cervical spine
were also generated.

[Series 3: bone windows · axial · 0.43mm/px · z∈[-162,-78]mm · 4 of 48 slices shown]
[im 10/48  bone]
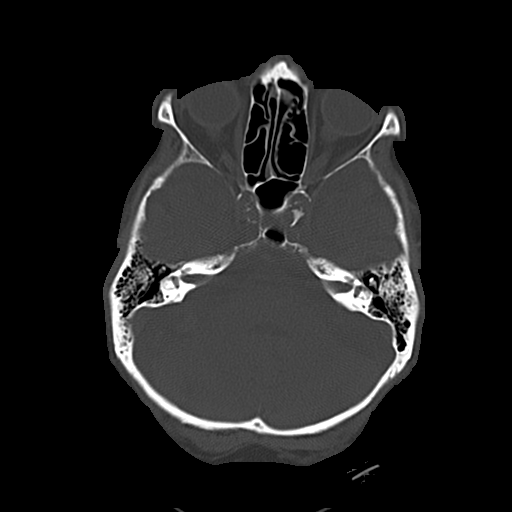
[im 19/48  bone]
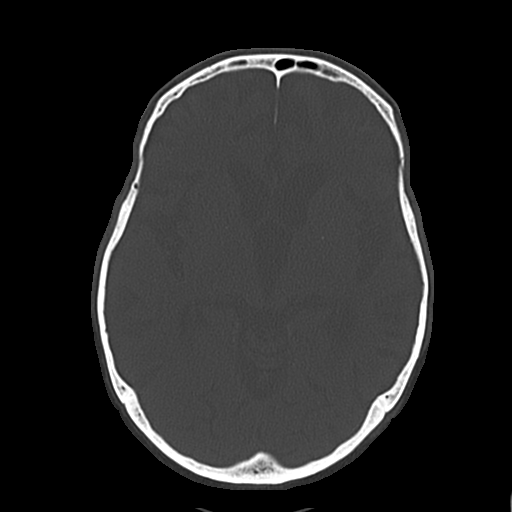
[im 29/48  bone]
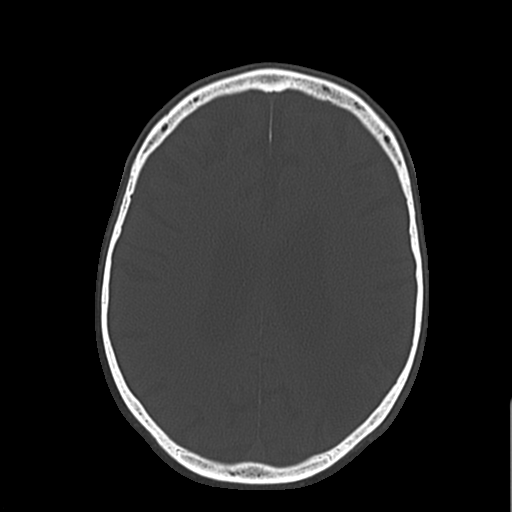
[im 38/48  bone]
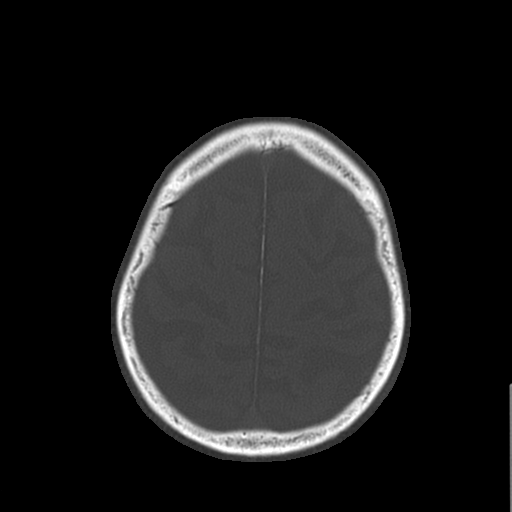

[Series 4: c-spine st · axial · 0.36mm/px · z∈[-334,-314]mm · 2 of 89 slices shown]
[im 10/89  brain]
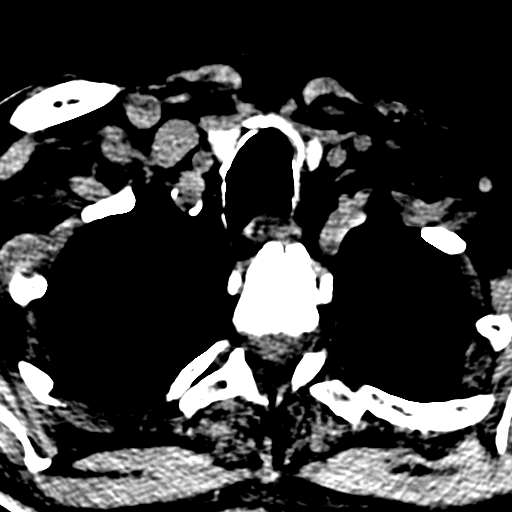
[im 20/89  brain]
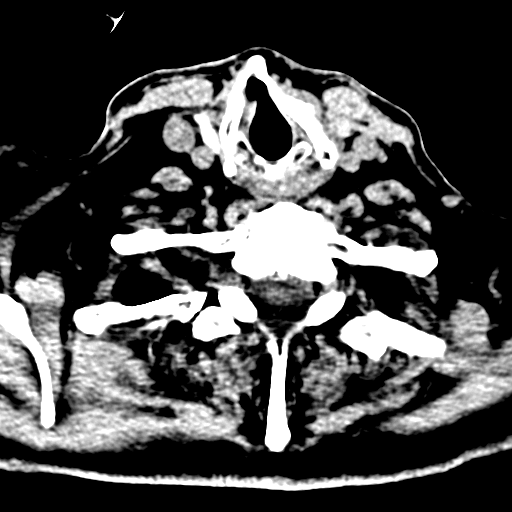

[Series 9: axial recon · axial · 0.23mm/px · z∈[-347,-213]mm · 9 of 93 slices shown, 12 images]
[im 10/93  brain]
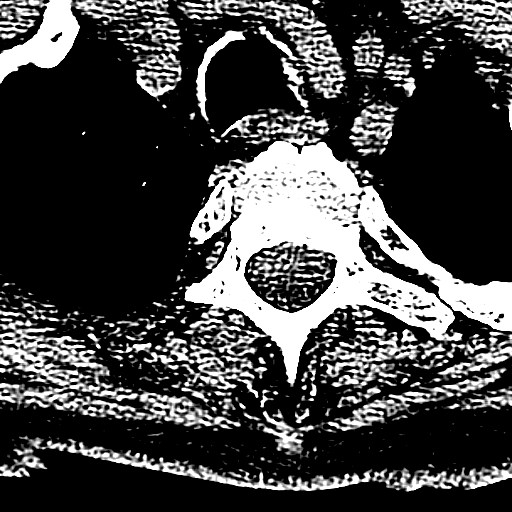
[im 10/93  bone]
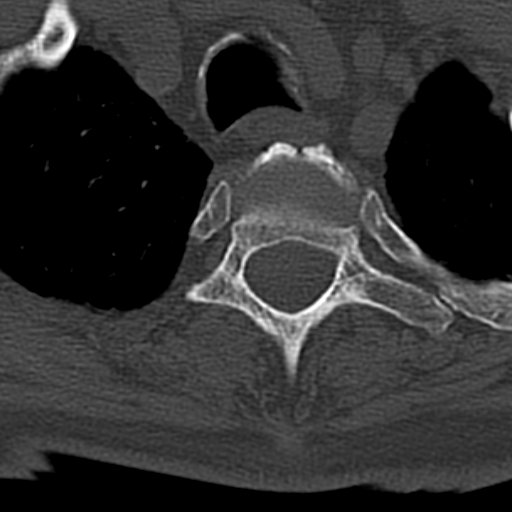
[im 19/93  brain]
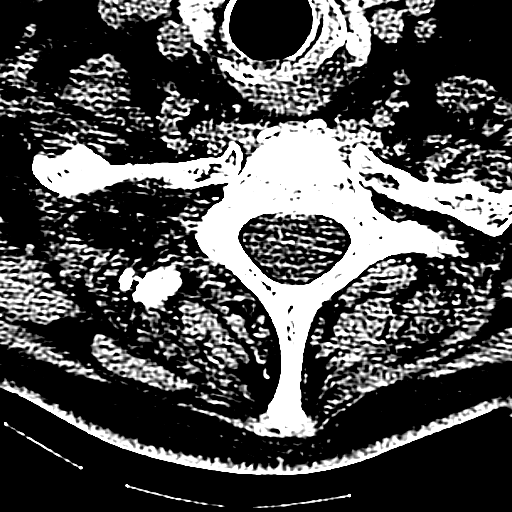
[im 28/93  brain]
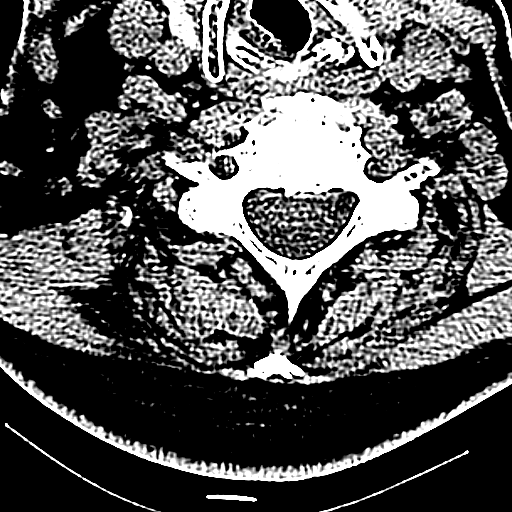
[im 37/93  brain]
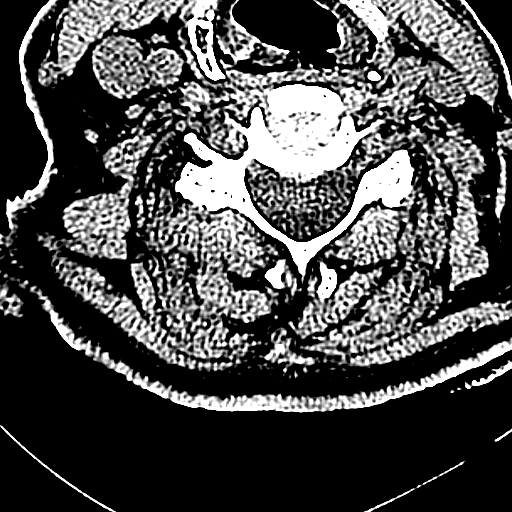
[im 47/93  brain]
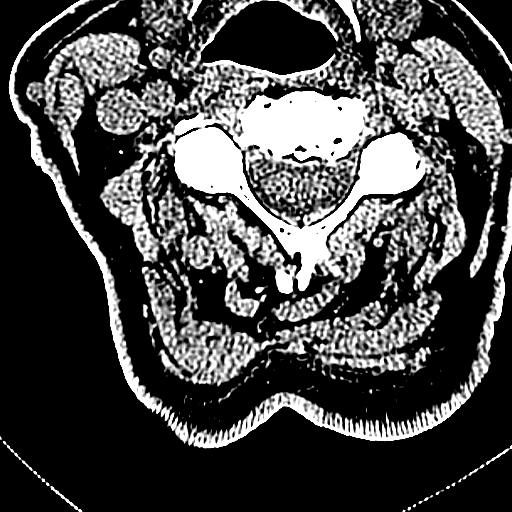
[im 47/93  bone]
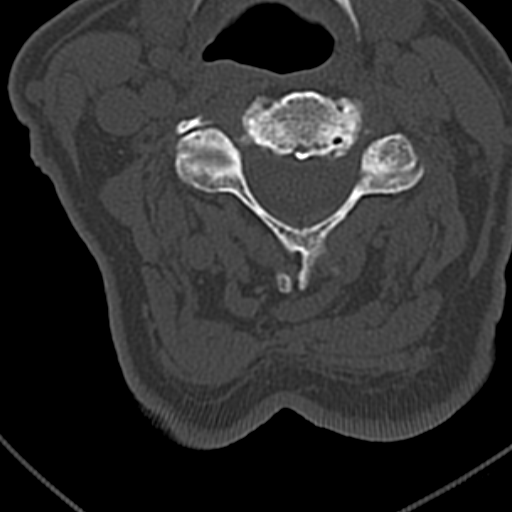
[im 56/93  brain]
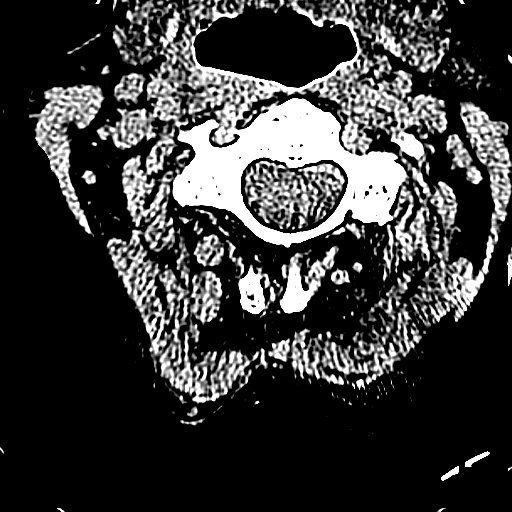
[im 65/93  brain]
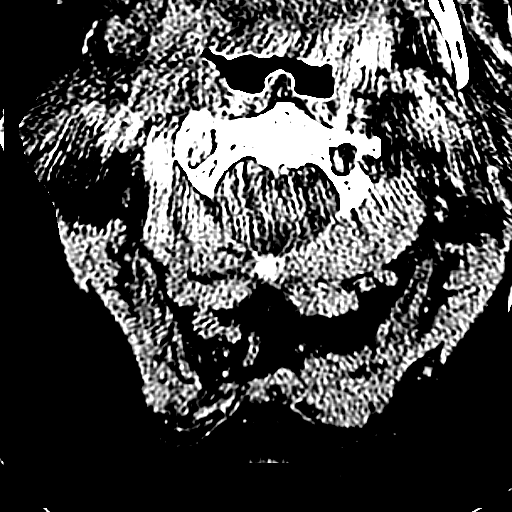
[im 74/93  brain]
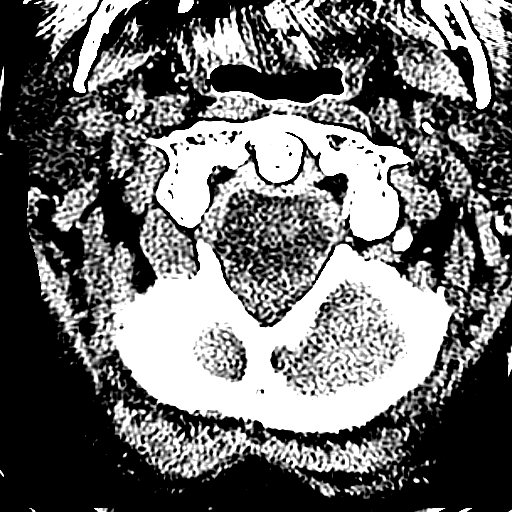
[im 83/93  brain]
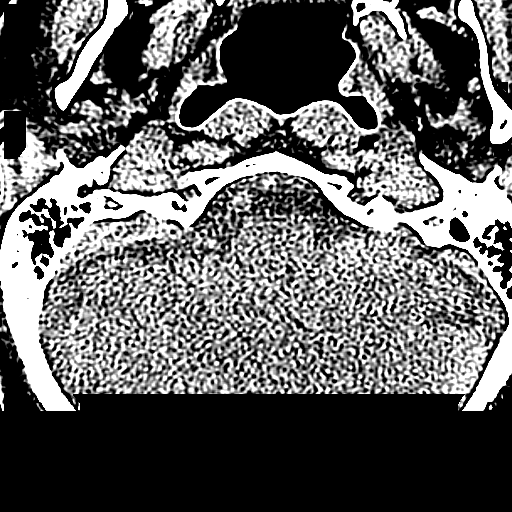
[im 83/93  bone]
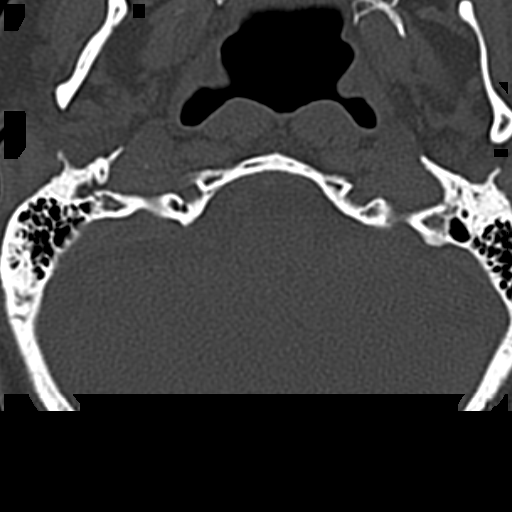

[15 of 30 positions shown; findings below may reference images not displayed]

FINDINGS: CT HEAD FINDINGS

Marked progression of brain atrophy since the prior study is well as
small vessel ischemic changes. There also is an interval
old-appearing infarct of the posterior left parietal cortex. The
brain demonstrates no evidence of hemorrhage, acute infarction,
edema, mass effect, extra-axial fluid collection, hydrocephalus or
mass lesion. No skull fracture identified.

CT CERVICAL SPINE FINDINGS

The cervical spine shows normal alignment. There is no evidence of
acute fracture or subluxation. No soft tissue swelling or hematoma
is identified. Degenerate spondylosis of the cervical spine present
at multiple levels, most prominently at C3-4, C4-5 and C6-7. There
is a minimal anterolisthesis of C5 on C6 of approximately 2 mm. No
bony lesions are seen.

Asymmetry of the airway is noted at roughly the level of the false
and true vocal cords with soft tissue thickening on the right
compared to the left. Some of this may represent some mucoid
material in the airway. However, underlying soft tissue lesion is
not excluded. Some of the findings could also relate to relative
paralysis of the right vocal cord compared to the left. Correlation
is suggested clinically.
IMPRESSION: 1. Marked progression of brain atrophy, small vessel disease and
interval old left parietal infarct since 7771. No acute findings
currently.
2. Cervical spondylosis without evidence of cervical fracture.
Minimal degenerative anterolisthesis of C5 on C6.
3. Asymmetry of the airway at the level of the vocal cords with soft
tissue thickening on the right. This could be a combination of
mucoid material, soft tissue lesion and/or relative right-sided
vocal cord paralysis.
